# Patient Record
Sex: Female | Born: 1975 | Race: Black or African American | Hispanic: No | Marital: Married | Smoking: Current every day smoker
Health system: Southern US, Community
[De-identification: ages and names within clinical notes are randomized; demographics above are authoritative.]

## PROBLEM LIST (undated history)

## (undated) DIAGNOSIS — E119 Type 2 diabetes mellitus without complications: Secondary | ICD-10-CM

## (undated) DIAGNOSIS — D219 Benign neoplasm of connective and other soft tissue, unspecified: Secondary | ICD-10-CM

## (undated) HISTORY — PX: LAPAROSCOPIC GASTRIC SLEEVE RESECTION: SHX5895

## (undated) HISTORY — PX: FOOT SURGERY: SHX648

## (undated) HISTORY — PX: CHOLECYSTECTOMY: SHX55

---

## 1998-04-20 ENCOUNTER — Emergency Department (HOSPITAL_COMMUNITY): Admission: EM | Admit: 1998-04-20 | Discharge: 1998-04-20 | Payer: Self-pay | Admitting: Emergency Medicine

## 1998-11-05 ENCOUNTER — Emergency Department (HOSPITAL_COMMUNITY): Admission: EM | Admit: 1998-11-05 | Discharge: 1998-11-05 | Payer: Self-pay | Admitting: Emergency Medicine

## 1999-03-13 ENCOUNTER — Encounter: Payer: Self-pay | Admitting: *Deleted

## 1999-03-13 ENCOUNTER — Inpatient Hospital Stay (HOSPITAL_COMMUNITY): Admission: AD | Admit: 1999-03-13 | Discharge: 1999-03-13 | Payer: Self-pay | Admitting: Obstetrics & Gynecology

## 1999-05-11 ENCOUNTER — Inpatient Hospital Stay (HOSPITAL_COMMUNITY): Admission: AD | Admit: 1999-05-11 | Discharge: 1999-05-11 | Payer: Self-pay | Admitting: *Deleted

## 2001-04-01 ENCOUNTER — Emergency Department (HOSPITAL_COMMUNITY): Admission: EM | Admit: 2001-04-01 | Discharge: 2001-04-01 | Payer: Self-pay | Admitting: *Deleted

## 2001-06-02 ENCOUNTER — Emergency Department (HOSPITAL_COMMUNITY): Admission: EM | Admit: 2001-06-02 | Discharge: 2001-06-03 | Payer: Self-pay | Admitting: Emergency Medicine

## 2001-08-16 ENCOUNTER — Inpatient Hospital Stay (HOSPITAL_COMMUNITY): Admission: AD | Admit: 2001-08-16 | Discharge: 2001-08-16 | Payer: Self-pay | Admitting: *Deleted

## 2001-08-21 ENCOUNTER — Other Ambulatory Visit: Admission: RE | Admit: 2001-08-21 | Discharge: 2001-08-21 | Payer: Self-pay | Admitting: Obstetrics and Gynecology

## 2002-02-06 ENCOUNTER — Inpatient Hospital Stay (HOSPITAL_COMMUNITY): Admission: AD | Admit: 2002-02-06 | Discharge: 2002-02-06 | Payer: Self-pay | Admitting: *Deleted

## 2002-02-07 ENCOUNTER — Inpatient Hospital Stay (HOSPITAL_COMMUNITY): Admission: AD | Admit: 2002-02-07 | Discharge: 2002-02-07 | Payer: Self-pay | Admitting: *Deleted

## 2002-02-07 ENCOUNTER — Encounter: Payer: Self-pay | Admitting: *Deleted

## 2002-12-10 ENCOUNTER — Inpatient Hospital Stay (HOSPITAL_COMMUNITY): Admission: AD | Admit: 2002-12-10 | Discharge: 2002-12-10 | Payer: Self-pay | Admitting: Obstetrics and Gynecology

## 2003-10-29 ENCOUNTER — Inpatient Hospital Stay (HOSPITAL_COMMUNITY): Admission: AD | Admit: 2003-10-29 | Discharge: 2003-10-29 | Payer: Self-pay | Admitting: Obstetrics & Gynecology

## 2005-08-14 ENCOUNTER — Inpatient Hospital Stay (HOSPITAL_COMMUNITY): Admission: AD | Admit: 2005-08-14 | Discharge: 2005-08-14 | Payer: Self-pay | Admitting: Family Medicine

## 2005-12-02 ENCOUNTER — Emergency Department (HOSPITAL_COMMUNITY): Admission: EM | Admit: 2005-12-02 | Discharge: 2005-12-03 | Payer: Self-pay | Admitting: Emergency Medicine

## 2005-12-03 ENCOUNTER — Emergency Department (HOSPITAL_COMMUNITY): Admission: EM | Admit: 2005-12-03 | Discharge: 2005-12-03 | Payer: Self-pay | Admitting: Emergency Medicine

## 2006-01-03 ENCOUNTER — Encounter: Admission: RE | Admit: 2006-01-03 | Discharge: 2006-01-03 | Payer: Self-pay | Admitting: Gastroenterology

## 2006-12-06 ENCOUNTER — Emergency Department (HOSPITAL_COMMUNITY): Admission: EM | Admit: 2006-12-06 | Discharge: 2006-12-06 | Payer: Self-pay | Admitting: Family Medicine

## 2006-12-25 HISTORY — PX: ABLATION: SHX5711

## 2007-06-26 ENCOUNTER — Emergency Department (HOSPITAL_COMMUNITY): Admission: EM | Admit: 2007-06-26 | Discharge: 2007-06-26 | Payer: Self-pay | Admitting: Family Medicine

## 2007-07-25 ENCOUNTER — Emergency Department (HOSPITAL_COMMUNITY): Admission: EM | Admit: 2007-07-25 | Discharge: 2007-07-25 | Payer: Self-pay | Admitting: Emergency Medicine

## 2007-10-13 ENCOUNTER — Encounter: Payer: Self-pay | Admitting: Emergency Medicine

## 2007-10-13 ENCOUNTER — Observation Stay (HOSPITAL_COMMUNITY): Admission: AD | Admit: 2007-10-13 | Discharge: 2007-10-14 | Payer: Self-pay | Admitting: Obstetrics and Gynecology

## 2007-10-15 ENCOUNTER — Observation Stay (HOSPITAL_COMMUNITY): Admission: AD | Admit: 2007-10-15 | Discharge: 2007-10-16 | Payer: Self-pay | Admitting: Obstetrics and Gynecology

## 2007-11-03 ENCOUNTER — Emergency Department (HOSPITAL_COMMUNITY): Admission: EM | Admit: 2007-11-03 | Discharge: 2007-11-03 | Payer: Self-pay | Admitting: Emergency Medicine

## 2007-11-25 ENCOUNTER — Inpatient Hospital Stay (HOSPITAL_COMMUNITY): Admission: AD | Admit: 2007-11-25 | Discharge: 2007-11-25 | Payer: Self-pay | Admitting: Obstetrics and Gynecology

## 2007-11-29 ENCOUNTER — Encounter (INDEPENDENT_AMBULATORY_CARE_PROVIDER_SITE_OTHER): Payer: Self-pay | Admitting: Obstetrics and Gynecology

## 2007-11-29 ENCOUNTER — Ambulatory Visit (HOSPITAL_COMMUNITY): Admission: AD | Admit: 2007-11-29 | Discharge: 2007-11-29 | Payer: Self-pay | Admitting: Obstetrics and Gynecology

## 2008-02-29 ENCOUNTER — Emergency Department (HOSPITAL_COMMUNITY): Admission: EM | Admit: 2008-02-29 | Discharge: 2008-02-29 | Payer: Self-pay | Admitting: Emergency Medicine

## 2009-07-23 ENCOUNTER — Inpatient Hospital Stay (HOSPITAL_COMMUNITY): Admission: AD | Admit: 2009-07-23 | Discharge: 2009-07-23 | Payer: Self-pay | Admitting: Obstetrics & Gynecology

## 2009-11-03 ENCOUNTER — Emergency Department (HOSPITAL_COMMUNITY): Admission: EM | Admit: 2009-11-03 | Discharge: 2009-11-03 | Payer: Self-pay | Admitting: Family Medicine

## 2010-01-18 ENCOUNTER — Encounter: Admission: RE | Admit: 2010-01-18 | Discharge: 2010-01-18 | Payer: Self-pay | Admitting: Family Medicine

## 2010-06-26 ENCOUNTER — Emergency Department (HOSPITAL_BASED_OUTPATIENT_CLINIC_OR_DEPARTMENT_OTHER): Admission: EM | Admit: 2010-06-26 | Discharge: 2010-06-26 | Payer: Self-pay | Admitting: Emergency Medicine

## 2011-05-09 NOTE — Discharge Summary (Signed)
[name], [name]           ACCOUNT NO.:  [id number]   MEDICAL RECORD NO.:  [id number]          PATIENT TYPE:  OBV   LOCATION:  9309                          FACILITY:  [facility]   PHYSICIAN:  Naima A. Dillard, M.D. DATE OF BIRTH:  28-Apr-1976   DATE OF ADMISSION:  10/15/2007  DATE OF DISCHARGE:  10/16/2007                               DISCHARGE SUMMARY   ADMITTING DIAGNOSIS:  Menorrhagia with anemia and hemodynamic changes.   DISCHARGE DIAGNOSES:  1. Anemia secondary to menorrhagia.  2. Status post 2 units packed red blood cells.   PROCEDURE:  Two units packed red blood cells administered.   HOSPITAL COURSE:  Ms. [name] is a 35 year old gravida 5, para 3-0-2-  3 who presented for 23-hour observation on the evening of October 15, 2007, secondary to menorrhagia with a hemoglobin of 7.6 at its nadir.  On October 14, 2007, she was admitted for 2 units of blood and was  discharged this morning subsequent to the completion of those 2 units.  Her history had been remarkable for being seen at [facility] ER on October 12, 2007, for evaluation of menorrhagia.  GC and  cultures were negative at that time.  Hemoglobin was 10.  She had  negative UPT and a negative urine culture.  Her blood type was B+.  She  was then seen at [facility] on October 13, 2007, for the same.  Dr. Su Hilt was consulted as the GYN on call.  Her hemoglobin at that  time was 7.6.  Her comprehensive metabolic panel was normal except for a  glucose of 108.  She was placed on Provera.  She then was seen by her  primary physician today with her hemoglobin of 8.1 and she did have  dizziness noted.  She was then admitted for the blood transfusion.  On  admission her vital signs were stable.  Orthostatics were within normal  limits.  She does have a history of hypertension.  Blood pressures were  130/90, pulse was normal and no orthostatic changes were noted.  Two  units of packed red blood  cells were administered with premedication  with Tylenol and Benadryl.  The patient tolerated these units very well.  The second unit was completed at approximately 4:00 a.m.  There was a  two-hour CBC done subsequent to that with a hemoglobin of 10.3, white  blood cell count of 7.8 and platelet count of 355.  Her blood pressure  was 131/71, pulse was 87.  She was afebrile, respirations 20.  She was  having no bleeding.  Her physical exam was within normal limits.  She  was deemed to receive full benefit of her hospital stay and was  discharged home.   DISCHARGE MEDICATIONS:  The patient will continue medications previously  ordered.  She will take iron one p.o. daily.  Provera 40 mg was given on  October 15, 2007.  Hydrocodone may be used as needed. Maxalt and butal  were also to be used on a p.r.n. basis.  No new medications were  ordered.   DISCHARGE INSTRUCTIONS:  The patient  will continue to monitor for any  hemodynamic changes.  She will notify us with any dizziness, syncope or  any other issues.  She also will notify us with any episodes of further  heavy bleeding.   DISCHARGE FOLLOW-UP:  On October 17, 2007, at [facility] at  3:45 p.m. with Dr. Su Hilt, per Dr. Su Hilt request.      Renaldo Reel. Emilee Hero, C.N.M.      Naima A. Normand Sloop, M.D.  Electronically Signed    VLL/MEDQ  D:  10/16/2007  T:  10/16/2007  Job:  [id number]

## 2011-05-09 NOTE — Op Note (Signed)
[name], [name]           ACCOUNT NO.:  [id number]   MEDICAL RECORD NO.:  [id number]          PATIENT TYPE:  AMB   LOCATION:  [facility]                          FACILITY:  [facility]   PHYSICIAN:  Osborn Coho, M.D.   DATE OF BIRTH:  01/28/1976   DATE OF PROCEDURE:  11/29/2007  DATE OF DISCHARGE:                               OPERATIVE REPORT   PREOPERATIVE DIAGNOSES:  1. Menorrhagia.  2. 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   POSTOPERATIVE DIAGNOSES:  1. Menorrhagia.  2. 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   PROCEDURES:  1. Hysteroscopy.  2. Dilation and curettage.  3. Endometrial ablation via NovaSure.   ATTENDING:  Osborn Coho, M.D.   ANESTHESIA:  General via LMA.   FINDINGS:  No obvious intracavitary lesion.   SPECIMENS TO PATHOLOGY:  Frozen section sent with benign endometrium.  Permanent section sent as well, still pending.   FLUIDS:  1100 mL.   URINE OUTPUT:  Approximate 50 mL via straight catheterization prior to  procedure.   HYSTEROSCOPIC FLUID DEFICIT OF LR:  215 mL.   ESTIMATED BLOOD LOSS:  Minimal.   COMPLICATIONS:  None.   FINDINGS:  Uterus sounded to 9 cm, cervical length measured 4 cm, cavity  length 5 cm, cavity width 4 cm.  Power at 110 watts and ablation time of  1 minute 30 seconds.   INDICATION:  [name] is a 36 year old para 3, status post BTL,  with episodes of menorrhagia who been on Provera secondary to a history  of simple hyperplasia without atypia on endometrial biopsy.  The patient  persisted to have heavy bleeding during the time period she was being  treated with the Provera to the point where she was feeling very  fatigued and wanting something additionally done in order to help with  the bleeding.  I discussed several options with the patient, which  included a hysteroscopy D&C and only if the pathology report showed  benign endometrium would an ablation be considered, as that would be  likely to help  the most as far as her bleeding is concerned.  I did talk  about definitive management with the patient as well with a  hysterectomy, and the patient was preferred the ablation instead.  She  understood the risks of possibly hiding an occult precancerous or  cancerous lesion but wanted to proceed if the frozen section showed  benign endometrium.  The risks, benefits and alternatives were discussed  at length with the patient.  The patient verbalized understanding and  consent was signed and witnessed.   PROCEDURE:  The patient was taken to the operating room after the risks,  benefits and alternatives were reviewed with the patient, the patient  verbalized understanding and consent signed and witnessed.  The patient  was placed under general per anesthesia and prepped and draped in the  normal sterile fashion in the dorsal lithotomy position.  A bivalve  speculum was placed in the patient's vagina and a paracervical block  administered using a total of 10 mL of 1% lidocaine.  A single-tooth  tenaculum had been used on the anterior lip  of the cervix and the  measurements as noted above in the findings.  The diagnostic  hysteroscope was then introduced and no obvious intracavitary lesions  were noted.  However, visualization was somewhat difficult secondary to  bleeding.  The hysteroscope was then removed and curettage performed and  the initial specimen sent for frozen section and the remaining portions  sent for permanent section.  Hysteroscope was introduced once again and  slightly better visualization and still no intracavitary lesions noted.  Pathologist called back with the report of benign endometrium on frozen  section and NovaSure ablation was then performed.  The cervix was  dilated for passage of the NovaSure instrument, which was introduced  into the intrauterine cavity.  Measurement as noted above.  Ablation was  performed for 1 minute 30 seconds at a power of 110 watts.   Instrument  was then removed and good results noted.  There was minimal bleeding at  the end of the case.  The tenaculum was removed and there was good  hemostasis at tenaculum site.  Count was correct.  The patient tolerated  the procedure well and was awaiting transfer to the recovery room in  good condition.  This case was moved up from its originally scheduled  date of December 19 secondary to the patient presenting in the office  today with heavy vaginal bleeding.      Osborn Coho, M.D.  Electronically Signed     AR/MEDQ  D:  11/29/2007  T:  11/30/2007  Job:  [id number]

## 2011-10-02 LAB — CBC
HCT: 33.2 — ABNORMAL LOW
HCT: 34.8 — ABNORMAL LOW
Hemoglobin: 11 — ABNORMAL LOW
Hemoglobin: 11.6 — ABNORMAL LOW
MCHC: 33.1
MCHC: 33.2
MCV: 80.4
MCV: 80.5
Platelets: 473 — ABNORMAL HIGH
Platelets: 595 — ABNORMAL HIGH
RBC: 4.12
RBC: 4.33
RDW: 18.2 — ABNORMAL HIGH
RDW: 18.9 — ABNORMAL HIGH
WBC: 10.1
WBC: 8.5

## 2011-10-02 LAB — POCT PREGNANCY, URINE
Operator id: 26329
Preg Test, Ur: NEGATIVE

## 2011-10-02 LAB — SAMPLE TO BLOOD BANK

## 2011-10-02 LAB — HCG, SERUM, QUALITATIVE: Preg, Serum: NEGATIVE

## 2011-10-03 LAB — POCT I-STAT CREATININE
Creatinine, Ser: 0.9
Operator id: 294511

## 2011-10-03 LAB — I-STAT 8, (EC8 V) (CONVERTED LAB)
Acid-base deficit: 2
BUN: 8
Bicarbonate: 23.8
Chloride: 106
Glucose, Bld: 74
HCT: 42
Hemoglobin: 14.3
Operator id: 294511
Potassium: 3.6
Sodium: 140
TCO2: 25
pCO2, Ven: 42.6 — ABNORMAL LOW
pH, Ven: 7.355 — ABNORMAL HIGH

## 2011-10-03 LAB — POCT CARDIAC MARKERS
CKMB, poc: <1 — ABNORMAL LOW
Myoglobin, poc: 75.1
Operator id: 294511
Troponin i, poc: <0.05

## 2011-10-03 LAB — D-DIMER, QUANTITATIVE: D-Dimer, Quant: 0.64 — ABNORMAL HIGH

## 2011-10-04 LAB — CBC
HCT: 23.6 — ABNORMAL LOW
HCT: 27.2 — ABNORMAL LOW
HCT: 28.6 — ABNORMAL LOW
HCT: 30.5 — ABNORMAL LOW
HCT: 31.8 — ABNORMAL LOW
Hemoglobin: 10 — ABNORMAL LOW
Hemoglobin: 10.3 — ABNORMAL LOW
Hemoglobin: 7.6 — CL
Hemoglobin: 8.9 — ABNORMAL LOW
Hemoglobin: 9.3 — ABNORMAL LOW
MCHC: 31.4
MCHC: 32.2
MCHC: 32.6
MCHC: 32.9
MCHC: 33.6
MCV: 75.6 — ABNORMAL LOW
MCV: 76.5 — ABNORMAL LOW
MCV: 76.7 — ABNORMAL LOW
MCV: 76.8 — ABNORMAL LOW
MCV: 78.4
Platelets: 355
Platelets: 367
Platelets: 428 — ABNORMAL HIGH
Platelets: 461 — ABNORMAL HIGH
Platelets: 488 — ABNORMAL HIGH
RBC: 3.09 — ABNORMAL LOW
RBC: 3.6 — ABNORMAL LOW
RBC: 3.73 — ABNORMAL LOW
RBC: 3.9
RBC: 4.14
RDW: 18.7 — ABNORMAL HIGH
RDW: 18.9 — ABNORMAL HIGH
RDW: 19.3 — ABNORMAL HIGH
RDW: 19.4 — ABNORMAL HIGH
RDW: 19.4 — ABNORMAL HIGH
WBC: 7.2
WBC: 7.8
WBC: 8.5
WBC: 9.4
WBC: 9.5

## 2011-10-04 LAB — DIFFERENTIAL
Basophils Absolute: 0
Basophils Relative: 0
Eosinophils Absolute: 0.1
Eosinophils Relative: 1
Lymphocytes Relative: 25
Lymphs Abs: 2.3
Monocytes Absolute: 0.4
Monocytes Relative: 4
Neutro Abs: 6.7
Neutrophils Relative %: 70

## 2011-10-04 LAB — COMPREHENSIVE METABOLIC PANEL
ALT: 20
AST: 35
Albumin: 2.4 — ABNORMAL LOW
Alkaline Phosphatase: 79
BUN: 4 — ABNORMAL LOW
CO2: 21
Calcium: 7.6 — ABNORMAL LOW
Chloride: 108
Creatinine, Ser: 0.7
GFR calc Af Amer: >60
GFR calc non Af Amer: >60
Glucose, Bld: 108 — ABNORMAL HIGH
Potassium: 3.7
Sodium: 135
Total Bilirubin: 0.4
Total Protein: 5.3 — ABNORMAL LOW

## 2011-10-04 LAB — URINE MICROSCOPIC-ADD ON: WBC, UA: NONE SEEN

## 2011-10-04 LAB — URINALYSIS, ROUTINE W REFLEX MICROSCOPIC
Glucose, UA: NEGATIVE
Ketones, ur: NEGATIVE
Leukocytes, UA: NEGATIVE
Nitrite: NEGATIVE
Protein, ur: NEGATIVE
Specific Gravity, Urine: >1.03 — ABNORMAL HIGH
Urobilinogen, UA: 0.2
pH: 6

## 2011-10-04 LAB — URINE CULTURE
Colony Count: NO GROWTH
Culture: NO GROWTH
Special Requests: NEGATIVE

## 2011-10-04 LAB — CROSSMATCH
ABO/RH(D): B POS
Antibody Screen: NEGATIVE

## 2011-10-04 LAB — TYPE AND SCREEN
ABO/RH(D): B POS
Antibody Screen: NEGATIVE

## 2011-10-04 LAB — POCT PREGNANCY, URINE
Operator id: 294511
Preg Test, Ur: NEGATIVE

## 2011-10-04 LAB — ABO/RH
ABO/RH(D): B POS
ABO/RH(D): B POS

## 2011-10-04 LAB — GC/CHLAMYDIA PROBE AMP, GENITAL
Chlamydia, DNA Probe: NEGATIVE
GC Probe Amp, Genital: NEGATIVE

## 2012-12-31 ENCOUNTER — Encounter (HOSPITAL_BASED_OUTPATIENT_CLINIC_OR_DEPARTMENT_OTHER): Payer: Self-pay | Admitting: *Deleted

## 2012-12-31 ENCOUNTER — Emergency Department (HOSPITAL_BASED_OUTPATIENT_CLINIC_OR_DEPARTMENT_OTHER)
Admission: EM | Admit: 2012-12-31 | Discharge: 2012-12-31 | Disposition: A | Discharge status: Home / Self Care | Payer: Medicaid Other | Attending: Emergency Medicine | Admitting: Emergency Medicine

## 2012-12-31 DIAGNOSIS — F17200 Nicotine dependence, unspecified, uncomplicated: Secondary | ICD-10-CM | POA: Insufficient documentation

## 2012-12-31 DIAGNOSIS — Y929 Unspecified place or not applicable: Secondary | ICD-10-CM | POA: Insufficient documentation

## 2012-12-31 DIAGNOSIS — S39012A Strain of muscle, fascia and tendon of lower back, initial encounter: Secondary | ICD-10-CM

## 2012-12-31 DIAGNOSIS — X58XXXA Exposure to other specified factors, initial encounter: Secondary | ICD-10-CM | POA: Insufficient documentation

## 2012-12-31 DIAGNOSIS — S335XXA Sprain of ligaments of lumbar spine, initial encounter: Secondary | ICD-10-CM | POA: Insufficient documentation

## 2012-12-31 DIAGNOSIS — Y939 Activity, unspecified: Secondary | ICD-10-CM | POA: Insufficient documentation

## 2012-12-31 MED ORDER — IBUPROFEN 800 MG PO TABS: 800.0000 mg | ORAL_TABLET | Freq: Three times a day (TID) | ORAL | Status: DC

## 2012-12-31 MED ORDER — KETOROLAC TROMETHAMINE 60 MG/2ML IM SOLN
60.0000 mg | Freq: Once | INTRAMUSCULAR | Status: AC
  Administered 2012-12-31: 60 mg via INTRAMUSCULAR
  Filled 2012-12-31: qty 2

## 2012-12-31 MED ORDER — HYDROMORPHONE HCL PF 2 MG/ML IJ SOLN
2.0000 mg | Freq: Once | INTRAMUSCULAR | Status: AC
  Administered 2012-12-31: 2 mg via INTRAMUSCULAR
  Filled 2012-12-31: qty 1

## 2012-12-31 MED ORDER — HYDROCODONE-ACETAMINOPHEN 5-325 MG PO TABS: 2.0000 | ORAL_TABLET | ORAL | Status: DC | PRN

## 2012-12-31 MED ORDER — CYCLOBENZAPRINE HCL 10 MG PO TABS: 10.0000 mg | ORAL_TABLET | Freq: Two times a day (BID) | ORAL | Status: DC | PRN

## 2012-12-31 NOTE — ED Provider Notes (Signed)
History     CSN: [id number]  Arrival date & time 12/31/12  1201   First MD Initiated Contact with Patient 12/31/12 1212      Chief Complaint  Patient presents with  . Back Pain    (Consider location/radiation/quality/duration/timing/severity/associated sxs/prior treatment) Patient is a 37 y.o. female presenting with back pain. The history is provided by the patient. No language interpreter was used.  Back Pain  This is a new problem. The current episode started 6 to 12 hours ago. The problem occurs constantly. The problem has been gradually worsening. The pain is associated with no known injury. The pain is present in the lumbar spine. The quality of the pain is described as aching. The pain does not radiate. The pain is at a severity of 8/10. The pain is moderate. The symptoms are aggravated by bending. Pertinent negatives include no paresthesias and no paresis. She has tried NSAIDs for the symptoms. The treatment provided no relief. Risk factors: last back pain 2011.    History reviewed. No pertinent past medical history.  Past Surgical History  Procedure Date  . Cholecystectomy   . Ablation 2008    History reviewed. No pertinent family history.  History  Substance Use Topics  . Smoking status: Current Every Day Smoker  . Smokeless tobacco: Not on file  . Alcohol Use: No    OB History    Grav Para Term Preterm Abortions TAB SAB Ect Mult Living                  Review of Systems  Musculoskeletal: Positive for back pain.  Neurological: Negative for paresthesias.  All other systems reviewed and are negative.    Allergies  Review of patient's allergies indicates no known allergies.  Home Medications  No current outpatient prescriptions on file.  There were no vitals taken for this visit.  Physical Exam  Nursing note and vitals reviewed. Constitutional: She appears well-developed and well-nourished.  HENT:  Head: Normocephalic and atraumatic.  Eyes:  Conjunctivae normal and EOM are normal. Pupils are equal, round, and reactive to light.  Neck: Normal range of motion. Neck supple.  Cardiovascular: Normal rate.   Pulmonary/Chest: Effort normal.  Abdominal: Soft. Bowel sounds are normal.  Musculoskeletal: Normal range of motion.       Tender ls spine,  nv and ns intact  Neurological: She is alert.  Skin: Skin is warm.    ED Course  Procedures (including critical care time)  Labs Reviewed - No data to display No results found.   1. Lumbar strain       MDM  Dilaudid and torodol IM.      Pt feels better,  Pt given Rx for flexeril,  Ibuprofen and hydrocodone.   I advised follow up with Dr. Phineas Douglas for recheck    Elson Areas, PA 12/31/12 1409  Lonia Skinner [place], [place] 12/31/12 1410

## 2012-12-31 NOTE — ED Notes (Signed)
Pt states had  Back injury in 2011 this morning she was getting out of the bed heard a little pop put feet on floor had spasms in back knees buckled is ambulatory to exam room but moving slowly denies any urinary symptoms

## 2013-01-01 NOTE — ED Provider Notes (Signed)
Medical screening examination/treatment/procedure(s) were performed by non-physician practitioner and as supervising physician I was immediately available for consultation/collaboration.   Charles B. Sheldon, MD 01/01/13 0720 

## 2013-02-05 ENCOUNTER — Emergency Department (HOSPITAL_BASED_OUTPATIENT_CLINIC_OR_DEPARTMENT_OTHER)
Admission: EM | Admit: 2013-02-05 | Discharge: 2013-02-05 | Disposition: A | Discharge status: Home / Self Care | Payer: Medicaid Other | Attending: Emergency Medicine | Admitting: Emergency Medicine

## 2013-02-05 ENCOUNTER — Encounter (HOSPITAL_BASED_OUTPATIENT_CLINIC_OR_DEPARTMENT_OTHER): Payer: Self-pay | Admitting: *Deleted

## 2013-02-05 DIAGNOSIS — N760 Acute vaginitis: Secondary | ICD-10-CM | POA: Insufficient documentation

## 2013-02-05 DIAGNOSIS — F17200 Nicotine dependence, unspecified, uncomplicated: Secondary | ICD-10-CM | POA: Insufficient documentation

## 2013-02-05 DIAGNOSIS — N390 Urinary tract infection, site not specified: Secondary | ICD-10-CM

## 2013-02-05 DIAGNOSIS — B9689 Other specified bacterial agents as the cause of diseases classified elsewhere: Secondary | ICD-10-CM

## 2013-02-05 DIAGNOSIS — R300 Dysuria: Secondary | ICD-10-CM | POA: Insufficient documentation

## 2013-02-05 DIAGNOSIS — Z3202 Encounter for pregnancy test, result negative: Secondary | ICD-10-CM | POA: Insufficient documentation

## 2013-02-05 LAB — URINALYSIS, ROUTINE W REFLEX MICROSCOPIC
Glucose, UA: NEGATIVE mg/dL
Hgb urine dipstick: NEGATIVE
Ketones, ur: 15 mg/dL — AB
Nitrite: NEGATIVE
Protein, ur: NEGATIVE mg/dL
Specific Gravity, Urine: 1.03 (ref 1.005–1.030)
Urobilinogen, UA: 0.2 mg/dL (ref 0.0–1.0)
pH: 5.5 (ref 5.0–8.0)

## 2013-02-05 LAB — WET PREP, GENITAL
Trich, Wet Prep: NONE SEEN
Yeast Wet Prep HPF POC: NONE SEEN

## 2013-02-05 LAB — URINE MICROSCOPIC-ADD ON

## 2013-02-05 LAB — PREGNANCY, URINE: Preg Test, Ur: NEGATIVE

## 2013-02-05 MED ORDER — SULFAMETHOXAZOLE-TRIMETHOPRIM 800-160 MG PO TABS: 1.0000 | ORAL_TABLET | Freq: Two times a day (BID) | ORAL | Status: DC

## 2013-02-05 MED ORDER — METRONIDAZOLE 500 MG PO TABS: 500.0000 mg | ORAL_TABLET | Freq: Two times a day (BID) | ORAL | Status: DC

## 2013-02-05 NOTE — ED Notes (Signed)
Pt c/o vaginal discharge x 1 week.  

## 2013-02-05 NOTE — ED Provider Notes (Signed)
History     CSN: [id number]  Arrival date & time 02/05/13  1323   First MD Initiated Contact with Patient 02/05/13 1333      Chief Complaint  Patient presents with  . Vaginal Discharge    (Consider location/radiation/quality/duration/timing/severity/associated sxs/prior treatment) HPI Comments: Pt states that she is having vaginal discharge with dysuria:pt states that she used monistat without relief  Patient is a 37 y.o. female presenting with vaginal discharge. The history is provided by the patient. No language interpreter was used.  Vaginal Discharge This is a new problem. The current episode started in the past 7 days. The problem occurs constantly. The problem has been unchanged. Pertinent negatives include no abdominal pain, fever or vomiting. Nothing aggravates the symptoms. She has tried nothing for the symptoms.    History reviewed. No pertinent past medical history.  Past Surgical History  Procedure Laterality Date  . Cholecystectomy    . Ablation  2008    History reviewed. No pertinent family history.  History  Substance Use Topics  . Smoking status: Current Every Day Smoker  . Smokeless tobacco: Not on file  . Alcohol Use: No    OB History   Grav Para Term Preterm Abortions TAB SAB Ect Mult Living                  Review of Systems  Constitutional: Negative for fever.  Respiratory: Negative.   Cardiovascular: Negative.   Gastrointestinal: Negative for vomiting and abdominal pain.  Genitourinary: Positive for vaginal discharge.    Allergies  Review of patient's allergies indicates no known allergies.  Home Medications   Current Outpatient Rx  Name  Route  Sig  Dispense  Refill  . cyclobenzaprine (FLEXERIL) 10 MG tablet   Oral   Take 1 tablet (10 mg total) by mouth 2 (two) times daily as needed for muscle spasms.   20 tablet   0   . HYDROcodone-acetaminophen (NORCO/VICODIN) 5-325 MG per tablet   Oral   Take 2 tablets by mouth every 4  (four) hours as needed for pain.   10 tablet   0   . ibuprofen (ADVIL,MOTRIN) 800 MG tablet   Oral   Take 1 tablet (800 mg total) by mouth 3 (three) times daily.   21 tablet   0     BP 141/84  Pulse 85  Temp(Src) 98.6 F (37 C) (Oral)  Resp 16  Ht 5\' 1"  (1.549 m)  Wt 195 lb (88.451 kg)  BMI 36.86 kg/m2  Physical Exam  Nursing note and vitals reviewed. Constitutional: She is oriented to person, place, and time. She appears well-developed and well-nourished.  Cardiovascular: Normal rate and regular rhythm.   Pulmonary/Chest: Effort normal and breath sounds normal.  Abdominal: Soft. Bowel sounds are normal. There is no tenderness.  Genitourinary:  White vaginal discharge  Musculoskeletal: Normal range of motion.  Neurological: She is alert and oriented to person, place, and time.  Skin: Skin is warm and dry.  Psychiatric: She has a normal mood and affect.    ED Course  Procedures (including critical care time)  Labs Reviewed  WET PREP, GENITAL - Abnormal; Notable for the following:    Clue Cells Wet Prep HPF POC FEW (*)    WBC, Wet Prep HPF POC FEW (*)    All other components within normal limits  URINALYSIS, ROUTINE W REFLEX MICROSCOPIC - Abnormal; Notable for the following:    Color, Urine AMBER (*)    APPearance CLOUDY (*)  Bilirubin Urine SMALL (*)    Ketones, ur 15 (*)    Leukocytes, UA MODERATE (*)    All other components within normal limits  URINE MICROSCOPIC-ADD ON - Abnormal; Notable for the following:    Bacteria, UA FEW (*)    Crystals CA OXALATE CRYSTALS (*)    All other components within normal limits  GC/CHLAMYDIA PROBE AMP  URINE CULTURE  PREGNANCY, URINE   No results found.   1. UTI (lower urinary tract infection)   2. BV (bacterial vaginosis)       MDM  Std cultures sent;will treat for bv and uti        Teressa Lower, NP 02/05/13 1421

## 2013-02-05 NOTE — ED Provider Notes (Signed)
Medical screening examination/treatment/procedure(s) were performed by non-physician practitioner and as supervising physician I was immediately available for consultation/collaboration.  Araly Kaas, MD 02/05/13 1531 

## 2013-02-07 LAB — GC/CHLAMYDIA PROBE AMP
CT Probe RNA: NEGATIVE
GC Probe RNA: NEGATIVE

## 2013-02-07 LAB — URINE CULTURE

## 2013-06-14 ENCOUNTER — Emergency Department (HOSPITAL_BASED_OUTPATIENT_CLINIC_OR_DEPARTMENT_OTHER)
Admission: EM | Admit: 2013-06-14 | Discharge: 2013-06-14 | Disposition: A | Discharge status: Home / Self Care | Payer: Medicaid Other | Attending: Emergency Medicine | Admitting: Emergency Medicine

## 2013-06-14 ENCOUNTER — Emergency Department (HOSPITAL_BASED_OUTPATIENT_CLINIC_OR_DEPARTMENT_OTHER): Payer: Medicaid Other

## 2013-06-14 ENCOUNTER — Encounter (HOSPITAL_BASED_OUTPATIENT_CLINIC_OR_DEPARTMENT_OTHER): Payer: Self-pay | Admitting: *Deleted

## 2013-06-14 DIAGNOSIS — Z9089 Acquired absence of other organs: Secondary | ICD-10-CM | POA: Insufficient documentation

## 2013-06-14 DIAGNOSIS — K921 Melena: Secondary | ICD-10-CM | POA: Insufficient documentation

## 2013-06-14 DIAGNOSIS — A499 Bacterial infection, unspecified: Secondary | ICD-10-CM | POA: Insufficient documentation

## 2013-06-14 DIAGNOSIS — Z3202 Encounter for pregnancy test, result negative: Secondary | ICD-10-CM | POA: Insufficient documentation

## 2013-06-14 DIAGNOSIS — F17200 Nicotine dependence, unspecified, uncomplicated: Secondary | ICD-10-CM | POA: Insufficient documentation

## 2013-06-14 DIAGNOSIS — B9689 Other specified bacterial agents as the cause of diseases classified elsewhere: Secondary | ICD-10-CM

## 2013-06-14 DIAGNOSIS — N760 Acute vaginitis: Secondary | ICD-10-CM | POA: Insufficient documentation

## 2013-06-14 DIAGNOSIS — R079 Chest pain, unspecified: Secondary | ICD-10-CM | POA: Insufficient documentation

## 2013-06-14 DIAGNOSIS — R197 Diarrhea, unspecified: Secondary | ICD-10-CM | POA: Insufficient documentation

## 2013-06-14 DIAGNOSIS — D259 Leiomyoma of uterus, unspecified: Secondary | ICD-10-CM

## 2013-06-14 LAB — COMPREHENSIVE METABOLIC PANEL
ALT: 13 U/L (ref 0–35)
AST: 17 U/L (ref 0–37)
Albumin: 3.6 g/dL (ref 3.5–5.2)
Alkaline Phosphatase: 95 U/L (ref 39–117)
BUN: 11 mg/dL (ref 6–23)
CO2: 22 mEq/L (ref 19–32)
Calcium: 9.4 mg/dL (ref 8.4–10.5)
Chloride: 107 mEq/L (ref 96–112)
Creatinine, Ser: 0.8 mg/dL (ref 0.50–1.10)
GFR calc Af Amer: >90 mL/min (ref >90)
GFR calc non Af Amer: >90 mL/min (ref >90)
Glucose, Bld: 98 mg/dL (ref 70–99)
Potassium: 3.7 mEq/L (ref 3.5–5.1)
Sodium: 139 mEq/L (ref 135–145)
Total Bilirubin: 0.2 mg/dL — ABNORMAL LOW (ref 0.3–1.2)
Total Protein: 7.4 g/dL (ref 6.0–8.3)

## 2013-06-14 LAB — WET PREP, GENITAL
Trich, Wet Prep: NONE SEEN
Yeast Wet Prep HPF POC: NONE SEEN

## 2013-06-14 LAB — CBC WITH DIFFERENTIAL/PLATELET
Basophils Absolute: 0 10*3/uL (ref 0.0–0.1)
Basophils Relative: 0 % (ref 0–1)
Eosinophils Absolute: 0.1 10*3/uL (ref 0.0–0.7)
Eosinophils Relative: 1 % (ref 0–5)
HCT: 41.9 % (ref 36.0–46.0)
Hemoglobin: 14.6 g/dL (ref 12.0–15.0)
Lymphocytes Relative: 40 % (ref 12–46)
Lymphs Abs: 3.8 10*3/uL (ref 0.7–4.0)
MCH: 32.5 pg (ref 26.0–34.0)
MCHC: 34.8 g/dL (ref 30.0–36.0)
MCV: 93.3 fL (ref 78.0–100.0)
Monocytes Absolute: 0.7 10*3/uL (ref 0.1–1.0)
Monocytes Relative: 7 % (ref 3–12)
Neutro Abs: 5 10*3/uL (ref 1.7–7.7)
Neutrophils Relative %: 52 % (ref 43–77)
Platelets: 316 10*3/uL (ref 150–400)
RBC: 4.49 MIL/uL (ref 3.87–5.11)
RDW: 12.7 % (ref 11.5–15.5)
WBC: 9.6 10*3/uL (ref 4.0–10.5)

## 2013-06-14 LAB — URINALYSIS, ROUTINE W REFLEX MICROSCOPIC
Bilirubin Urine: NEGATIVE
Glucose, UA: NEGATIVE mg/dL
Hgb urine dipstick: NEGATIVE
Ketones, ur: 15 mg/dL — AB
Leukocytes, UA: NEGATIVE
Nitrite: NEGATIVE
Protein, ur: NEGATIVE mg/dL
Specific Gravity, Urine: 1.03 (ref 1.005–1.030)
Urobilinogen, UA: 1 mg/dL (ref 0.0–1.0)
pH: 6 (ref 5.0–8.0)

## 2013-06-14 LAB — PREGNANCY, URINE: Preg Test, Ur: NEGATIVE

## 2013-06-14 MED ORDER — METRONIDAZOLE 500 MG PO TABS: 500.0000 mg | ORAL_TABLET | Freq: Two times a day (BID) | ORAL | Status: DC

## 2013-06-14 MED ORDER — MORPHINE SULFATE 4 MG/ML IJ SOLN
4.0000 mg | Freq: Once | INTRAMUSCULAR | Status: AC
  Administered 2013-06-14: 4 mg via INTRAMUSCULAR
  Filled 2013-06-14: qty 1

## 2013-06-14 MED ORDER — HYDROCODONE-ACETAMINOPHEN 5-325 MG PO TABS: 1.0000 | ORAL_TABLET | ORAL | Status: DC | PRN

## 2013-06-14 MED ORDER — ONDANSETRON HCL 4 MG/2ML IJ SOLN
4.0000 mg | Freq: Once | INTRAMUSCULAR | Status: AC
  Administered 2013-06-14: 4 mg via INTRAMUSCULAR
  Filled 2013-06-14: qty 2

## 2013-06-14 NOTE — ED Notes (Signed)
Pelvic cart is at the bedside set up and ready for the doctor to use. 

## 2013-06-14 NOTE — ED Provider Notes (Signed)
History    This chart was scribed for Krista Human, MD by Quintella Reichert, ED scribe.  This patient was seen in room MH09/MH09 and the patient's care was started at 3:37 PM.   CSN: [id number]  Arrival date & time 06/14/13  1422         Chief Complaint  Patient presents with  . Abdominal Pain     The history is provided by the patient. No language interpreter was used.    HPI Comments: [name] is a 37 y.o. female with h/o endometriosis, fibroids and endometrial ablation who presents to the Emergency Department complaining of progressively-worsening, constant, moderate lower abdominal pain that began 1.5 weeks ago, with accompanying intermittent diarrhea.  Pain is relieved slightly by contracting her legs upward.  She has attempted to treat pain with ibuprofen 1x/day, without relief.  She notes that her stool had some red tint on one occasion but she is not sure whether it was blood or red food.  She also reports 1 short episode of sharp, stabbing right-sided chest pain 1 week ago that "took my breath away."  She denies fever, emesis, dysuria, ear pain, sore throat, cough, rash, syncope, seizure, or any other associated symptoms.  She notes that she does not have regular menstrual periods subsequent to her ablation, but that 1 month ago she experienced spotting for 3 days and had to use a pad.  When her pain began she thought symptoms were cramping and that her menstrual period may be "trying to start back up," but her pain persisted.  She denies any other chronic medical conditions or regular medication usage.  Pt is a current half-pack-a-day smoker.   History reviewed. No pertinent past medical history.  Past Surgical History  Procedure Laterality Date  . Cholecystectomy    . Ablation  2008    History reviewed. No pertinent family history.  History  Substance Use Topics  . Smoking status: Current Every Day Smoker  . Smokeless tobacco: Not on file  . Alcohol Use: No     OB History   Grav Para Term Preterm Abortions TAB SAB Ect Mult Living                  Review of Systems  Constitutional: Negative for fever.  HENT: Negative for ear pain and sore throat.   Respiratory: Negative for cough.   Cardiovascular: Positive for chest pain (One short episode a week ago).  Gastrointestinal: Positive for abdominal pain, diarrhea and blood in stool (Uncertain). Negative for vomiting.  Genitourinary: Negative for dysuria and difficulty urinating.  Skin: Negative for rash.  Neurological: Negative for seizures and syncope.  All other systems reviewed and are negative.    Allergies  Review of patient's allergies indicates no known allergies.  Home Medications   Current Outpatient Rx  Name  Route  Sig  Dispense  Refill  . cyclobenzaprine (FLEXERIL) 10 MG tablet   Oral   Take 1 tablet (10 mg total) by mouth 2 (two) times daily as needed for muscle spasms.   20 tablet   0   . HYDROcodone-acetaminophen (NORCO/VICODIN) 5-325 MG per tablet   Oral   Take 2 tablets by mouth every 4 (four) hours as needed for pain.   10 tablet   0   . ibuprofen (ADVIL,MOTRIN) 800 MG tablet   Oral   Take 1 tablet (800 mg total) by mouth 3 (three) times daily.   21 tablet   0   .  metroNIDAZOLE (FLAGYL) 500 MG tablet   Oral   Take 1 tablet (500 mg total) by mouth 2 (two) times daily.   14 tablet   0   . sulfamethoxazole-trimethoprim (SEPTRA DS) 800-160 MG per tablet   Oral   Take 1 tablet by mouth every 12 (twelve) hours.   10 tablet   0     BP 138/93  Pulse 82  Temp(Src) 98 F (36.7 C) (Oral)  Resp 20  Ht 5\' 1"  (1.549 m)  Wt 203 lb (92.08 kg)  BMI 38.38 kg/m2  SpO2 100%  Physical Exam  Nursing note and vitals reviewed. Constitutional: She is oriented to person, place, and time. She appears well-developed and well-nourished. No distress.  HENT:  Head: Normocephalic and atraumatic.  Right Ear: External ear normal.  Left Ear: External ear normal.   Nose: Nose normal.  Mouth/Throat: Oropharynx is clear and moist. No oropharyngeal exudate.  Eyes: EOM are normal.  Neck: Normal range of motion. Neck supple. No tracheal deviation present.  Cardiovascular: Normal rate, regular rhythm and normal heart sounds.   No murmur heard. Pulmonary/Chest: Effort normal and breath sounds normal. No respiratory distress. She has no wheezes. She has no rales.  Abdominal: Soft. Bowel sounds are normal. She exhibits no mass. There is tenderness (LLQ). There is no rebound.  No rigidity  Genitourinary:  Normal female external genitalia.  She has minimal mucoid discharge.  Bimanual exam shows left adnexal tenderness, no mass.  Uterus, right adnexa WNL.  Musculoskeletal: Normal range of motion. She exhibits no edema.  Extremities normal  Lymphadenopathy:    She has no cervical adenopathy.  Neurological: She is alert and oriented to person, place, and time. She exhibits normal muscle tone. Coordination normal.  Neurologically intact  Skin: Skin is warm and dry.  Psychiatric: She has a normal mood and affect. Her behavior is normal.    ED Course  Procedures (including critical care time)  DIAGNOSTIC STUDIES: Oxygen Saturation is 100% on room air, normal by my interpretation.    COORDINATION OF CARE: 3:44 PM-Discussed treatment plan which includes pain medication and labs with pt at bedside and pt agreed to plan.    Results for orders placed during the hospital encounter of 06/14/13  WET PREP, GENITAL      Result Value Range   Yeast Wet Prep HPF POC NONE SEEN  NONE SEEN   Trich, Wet Prep NONE SEEN  NONE SEEN   Clue Cells Wet Prep HPF POC MODERATE (*) NONE SEEN   WBC, Wet Prep HPF POC FEW (*) NONE SEEN  URINALYSIS, ROUTINE W REFLEX MICROSCOPIC      Result Value Range   Color, Urine YELLOW  YELLOW   APPearance CLEAR  CLEAR   Specific Gravity, Urine 1.030  1.005 - 1.030   pH 6.0  5.0 - 8.0   Glucose, UA NEGATIVE  NEGATIVE mg/dL   Hgb urine  dipstick NEGATIVE  NEGATIVE   Bilirubin Urine NEGATIVE  NEGATIVE   Ketones, ur 15 (*) NEGATIVE mg/dL   Protein, ur NEGATIVE  NEGATIVE mg/dL   Urobilinogen, UA 1.0  0.0 - 1.0 mg/dL   Nitrite NEGATIVE  NEGATIVE   Leukocytes, UA NEGATIVE  NEGATIVE  PREGNANCY, URINE      Result Value Range   Preg Test, Ur NEGATIVE  NEGATIVE  CBC WITH DIFFERENTIAL      Result Value Range   WBC 9.6  4.0 - 10.5 K/uL   RBC 4.49  3.87 - 5.11 MIL/uL  Hemoglobin 14.6  12.0 - 15.0 g/dL   HCT 16.1  09.6 - 04.5 %   MCV 93.3  78.0 - 100.0 fL   MCH 32.5  26.0 - 34.0 pg   MCHC 34.8  30.0 - 36.0 g/dL   RDW 40.9  81.1 - 91.4 %   Platelets 316  150 - 400 K/uL   Neutrophils Relative % 52  43 - 77 %   Neutro Abs 5.0  1.7 - 7.7 K/uL   Lymphocytes Relative 40  12 - 46 %   Lymphs Abs 3.8  0.7 - 4.0 K/uL   Monocytes Relative 7  3 - 12 %   Monocytes Absolute 0.7  0.1 - 1.0 K/uL   Eosinophils Relative 1  0 - 5 %   Eosinophils Absolute 0.1  0.0 - 0.7 K/uL   Basophils Relative 0  0 - 1 %   Basophils Absolute 0.0  0.0 - 0.1 K/uL  COMPREHENSIVE METABOLIC PANEL      Result Value Range   Sodium 139  135 - 145 mEq/L   Potassium 3.7  3.5 - 5.1 mEq/L   Chloride 107  96 - 112 mEq/L   CO2 22  19 - 32 mEq/L   Glucose, Bld 98  70 - 99 mg/dL   BUN 11  6 - 23 mg/dL   Creatinine, Ser 7.82  0.50 - 1.10 mg/dL   Calcium 9.4  8.4 - 95.6 mg/dL   Total Protein 7.4  6.0 - 8.3 g/dL   Albumin 3.6  3.5 - 5.2 g/dL   AST 17  0 - 37 U/L   ALT 13  0 - 35 U/L   Alkaline Phosphatase 95  39 - 117 U/L   Total Bilirubin 0.2 (*) 0.3 - 1.2 mg/dL   GFR calc non Af Amer >90  >90 mL/min   GFR calc Af Amer >90  >90 mL/min    US Transvaginal Non-ob  06/14/2013   *RADIOLOGY REPORT*  Clinical Data: Left lower quadrant pain for 2 weeks  TRANSABDOMINAL AND TRANSVAGINAL ULTRASOUND OF PELVIS  Technique:  Both transabdominal and transvaginal ultrasound examinations of the pelvis were performed.  Transabdominal technique was performed for global imaging of  the pelvis including uterus, ovaries, adnexal regions, and pelvic cul-de-sac.  It was necessary to proceed with endovaginal exam following the transabdominal exam to visualize the the uterus, endometrium and ovaries.  Comparison:  None.  Findings: Uterus:  The uterus measures 8.8 x 4.5 x 5.8 cm.  Fibroid within the posterior myometrium is identified Measuring 2.4 x 1.9 x 2.4 cm.  Endometrium: Measures 1.9 mm.  Normal in appearance.  Right ovary: Measures 2.2 x 1.3 x 2.0 cm.    Normal appearance/no adnexal mass.  Left ovary: Measures 3.4 x 2.1 x 3.0 cm.  Normal appearance/no adnexal mass  Other Findings:  No free fluid  IMPRESSION:  1.  No acute findings.  No findings to explain left lower quadrant pain. 2.  Uterine fibroid.   Original Report Authenticated By: Signa Kell, M.D.    US Pelvis Complete  06/14/2013   *RADIOLOGY REPORT*  Clinical Data: Left lower quadrant pain for 2 weeks  TRANSABDOMINAL AND TRANSVAGINAL ULTRASOUND OF PELVIS  Technique:  Both transabdominal and transvaginal ultrasound examinations of the pelvis were performed.  Transabdominal technique was performed for global imaging of the pelvis including uterus, ovaries, adnexal regions, and pelvic cul-de-sac.  It was necessary to proceed with endovaginal exam following the transabdominal exam to visualize the the uterus,  endometrium and ovaries.  Comparison:  None.  Findings: Uterus:  The uterus measures 8.8 x 4.5 x 5.8 cm.  Fibroid within the posterior myometrium is identified Measuring 2.4 x 1.9 x 2.4 cm.  Endometrium: Measures 1.9 mm.  Normal in appearance.  Right ovary: Measures 2.2 x 1.3 x 2.0 cm.    Normal appearance/no adnexal mass.  Left ovary: Measures 3.4 x 2.1 x 3.0 cm.  Normal appearance/no adnexal mass  Other Findings:  No free fluid  IMPRESSION:  1.  No acute findings.  No findings to explain left lower quadrant pain. 2.  Uterine fibroid.   Original Report Authenticated By: Signa Kell, M.D.    6:51 PM: Informed pt that labs  and blood-work were normal but that UA reveals bacterial vaginosis and US reveals a fibroid.  Explained that symptoms are likely due to vaginosis.  Discussed treatment plan including antibiotics and pain medication to treat vaginosis and f/u with OB/GYN if symptoms do not improve.  Pt expressed understanding and was agreeable.   1. Bacterial vaginosis   2. Uterine fibroid    I personally performed the services described in this documentation, which was scribed in my presence. The recorded information has been reviewed and is accurate.  [name], M.D.     Carleene Cooper III, MD 06/15/13 1311

## 2013-06-14 NOTE — ED Notes (Signed)
Pt describes LLQ "cramping" x 2 weeks. Denies urinary s/s or vaginal d/c.

## 2013-06-16 LAB — GC/CHLAMYDIA PROBE AMP
CT Probe RNA: NEGATIVE
GC Probe RNA: NEGATIVE

## 2013-06-17 ENCOUNTER — Emergency Department (HOSPITAL_BASED_OUTPATIENT_CLINIC_OR_DEPARTMENT_OTHER)
Admission: EM | Admit: 2013-06-17 | Discharge: 2013-06-17 | Disposition: A | Discharge status: Home / Self Care | Payer: Medicaid Other | Attending: Emergency Medicine | Admitting: Emergency Medicine

## 2013-06-17 ENCOUNTER — Encounter (HOSPITAL_BASED_OUTPATIENT_CLINIC_OR_DEPARTMENT_OTHER): Payer: Self-pay | Admitting: *Deleted

## 2013-06-17 DIAGNOSIS — F17200 Nicotine dependence, unspecified, uncomplicated: Secondary | ICD-10-CM | POA: Insufficient documentation

## 2013-06-17 DIAGNOSIS — D259 Leiomyoma of uterus, unspecified: Secondary | ICD-10-CM | POA: Insufficient documentation

## 2013-06-17 DIAGNOSIS — R110 Nausea: Secondary | ICD-10-CM | POA: Insufficient documentation

## 2013-06-17 DIAGNOSIS — Z8742 Personal history of other diseases of the female genital tract: Secondary | ICD-10-CM | POA: Insufficient documentation

## 2013-06-17 DIAGNOSIS — Z9089 Acquired absence of other organs: Secondary | ICD-10-CM | POA: Insufficient documentation

## 2013-06-17 DIAGNOSIS — R109 Unspecified abdominal pain: Secondary | ICD-10-CM | POA: Insufficient documentation

## 2013-06-17 HISTORY — DX: Benign neoplasm of connective and other soft tissue, unspecified: D21.9

## 2013-06-17 HISTORY — PX: ABDOMINAL HYSTERECTOMY: SHX81

## 2013-06-17 MED ORDER — DIPHENHYDRAMINE HCL 25 MG PO CAPS: 25.0000 mg | ORAL_CAPSULE | Freq: Four times a day (QID) | ORAL | Status: DC | PRN

## 2013-06-17 MED ORDER — ONDANSETRON 4 MG PO TBDP: ORAL_TABLET | ORAL | Status: DC

## 2013-06-17 MED ORDER — OXYCODONE-ACETAMINOPHEN 5-325 MG PO TABS: 1.0000 | ORAL_TABLET | ORAL | Status: DC | PRN

## 2013-06-17 MED ORDER — OXYCODONE-ACETAMINOPHEN 5-325 MG PO TABS
1.0000 | ORAL_TABLET | Freq: Once | ORAL | Status: AC
  Administered 2013-06-17: 1 via ORAL
  Filled 2013-06-17: qty 1

## 2013-06-17 MED ORDER — ONDANSETRON 4 MG PO TBDP
4.0000 mg | ORAL_TABLET | Freq: Once | ORAL | Status: AC
  Administered 2013-06-17: 4 mg via ORAL
  Filled 2013-06-17: qty 1

## 2013-06-17 MED ORDER — DIPHENHYDRAMINE HCL 25 MG PO CAPS
25.0000 mg | ORAL_CAPSULE | Freq: Once | ORAL | Status: AC
  Administered 2013-06-17: 25 mg via ORAL
  Filled 2013-06-17: qty 1

## 2013-06-17 NOTE — ED Notes (Signed)
MD at bedside.  Family present.

## 2013-06-17 NOTE — ED Provider Notes (Signed)
History    CSN: [id number] Arrival date & time 06/17/13  1451  First MD Initiated Contact with Patient 06/17/13 1517     Chief Complaint  Patient presents with  . Abdominal Pain   (Consider location/radiation/quality/duration/timing/severity/associated sxs/prior Treatment) HPI Pt seen 3 days ago for same abdominal pain. Had [place] that showed fibroid and wet prep with clue cells. Pt states she has history of endometriosis. Has f/u appointment with Ob/GYN in early July. Pt present today due to not being able to tolerate her hydrocodone. States she has itching after taking it. No oral swelling or SOB. No rash. No new vaginal bleeding or d/c. No fever, chills.  Past Medical History  Diagnosis Date  . Fibroids    Past Surgical History  Procedure Laterality Date  . Cholecystectomy    . Ablation  2008    uterine   No family history on file. History  Substance Use Topics  . Smoking status: Current Every Day Smoker  . Smokeless tobacco: Never Used  . Alcohol Use: 0.6 oz/week    1 Cans of beer per week   OB History   Grav Para Term Preterm Abortions TAB SAB Ect Mult Living                 Review of Systems  Constitutional: Negative for fever and chills.  HENT: Negative for facial swelling, trouble swallowing and voice change.   Respiratory: Negative for shortness of breath.   Cardiovascular: Negative for chest pain.  Gastrointestinal: Positive for nausea and abdominal pain. Negative for vomiting, diarrhea and constipation.  Genitourinary: Negative for dysuria, flank pain, vaginal bleeding, vaginal discharge and difficulty urinating.  Musculoskeletal: Negative for myalgias and back pain.  Skin: Negative for rash and wound.  Neurological: Negative for dizziness, weakness, light-headedness, numbness and headaches.  All other systems reviewed and are negative.    Allergies  Review of patient's allergies indicates no known allergies.  Home Medications   Current Outpatient Rx   Name  Route  Sig  Dispense  Refill  . HYDROcodone-acetaminophen (NORCO/VICODIN) 5-325 MG per tablet   Oral   Take 1 tablet by mouth every 4 (four) hours as needed for pain.   20 tablet   0   . cyclobenzaprine (FLEXERIL) 10 MG tablet   Oral   Take 1 tablet (10 mg total) by mouth 2 (two) times daily as needed for muscle spasms.   20 tablet   0   . diphenhydrAMINE (BENADRYL) 25 mg capsule   Oral   Take 1 capsule (25 mg total) by mouth every 6 (six) hours as needed for itching.   30 capsule   0   . HYDROcodone-acetaminophen (NORCO/VICODIN) 5-325 MG per tablet   Oral   Take 2 tablets by mouth every 4 (four) hours as needed for pain.   10 tablet   0   . ibuprofen (ADVIL,MOTRIN) 800 MG tablet   Oral   Take 1 tablet (800 mg total) by mouth 3 (three) times daily.   21 tablet   0   . metroNIDAZOLE (FLAGYL) 500 MG tablet   Oral   Take 1 tablet (500 mg total) by mouth 2 (two) times daily.   14 tablet   0   . metroNIDAZOLE (FLAGYL) 500 MG tablet   Oral   Take 1 tablet (500 mg total) by mouth 2 (two) times daily.   14 tablet   0   . ondansetron (ZOFRAN ODT) 4 MG disintegrating tablet  4mg  ODT q4 hours prn nausea/vomit   4 tablet   0   . oxyCODONE-acetaminophen (PERCOCET) 5-325 MG per tablet   Oral   Take 1 tablet by mouth every 4 (four) hours as needed for pain.   10 tablet   0   . sulfamethoxazole-trimethoprim (SEPTRA DS) 800-160 MG per tablet   Oral   Take 1 tablet by mouth every 12 (twelve) hours.   10 tablet   0    BP 138/84  Pulse 85  Temp(Src) 98.9 F (37.2 C) (Oral)  Resp 16  Ht 5\' 1"  (1.549 m)  Wt 203 lb (92.08 kg)  BMI 38.38 kg/m2  SpO2 98% Physical Exam  Nursing note and vitals reviewed. Constitutional: She is oriented to person, place, and time. She appears well-developed and well-nourished. No distress.  Pt watching television on iPad in no apparent distress  HENT:  Head: Normocephalic and atraumatic.  Mouth/Throat: Oropharynx is clear  and moist.  Eyes: EOM are normal. Pupils are equal, round, and reactive to light.  Neck: Normal range of motion. Neck supple.  Cardiovascular: Normal rate and regular rhythm.   Pulmonary/Chest: Effort normal and breath sounds normal. No respiratory distress. She has no wheezes. She has no rales.  Abdominal: Soft. Bowel sounds are normal. She exhibits no distension and no mass. There is tenderness (very mild LLQ/L adnexal TTP. No rebound or guarding). There is no rebound and no guarding.  Musculoskeletal: Normal range of motion. She exhibits no edema and no tenderness.  Neurological: She is alert and oriented to person, place, and time.  Skin: Skin is warm and dry. No rash noted. No erythema.  Psychiatric: She has a normal mood and affect. Her behavior is normal.    ED Course  Procedures (including critical care time) Labs Reviewed - No data to display No results found. 1. Abdominal pain     MDM  Pt with recent labs, pelvic exam and pelvic US. Do not feel repeat work up is needed. Will give oxycodone and monitor for a reaction. If tolerates well will d/c home to f/u with OB/Gyn.   Pt observed in ED several hours without reaction. Question whether itching from hydrocodone or flagyl. Will change pain meds to percocet and if reaction continues pt is instructed to stop abx. Return precautions given. Pt will f/u with OB/GYN  Loren Racer, MD 06/17/13 1654

## 2013-06-17 NOTE — ED Notes (Signed)
abd pain x "a couple weeks"-seen here Saturday and US showed fibroids- pt reports increasing pain- has appt on July 8

## 2013-10-11 ENCOUNTER — Emergency Department (HOSPITAL_BASED_OUTPATIENT_CLINIC_OR_DEPARTMENT_OTHER): Payer: Worker's Compensation

## 2013-10-11 ENCOUNTER — Emergency Department (HOSPITAL_BASED_OUTPATIENT_CLINIC_OR_DEPARTMENT_OTHER)
Admission: EM | Admit: 2013-10-11 | Discharge: 2013-10-11 | Disposition: A | Discharge status: Home / Self Care | Payer: Worker's Compensation | Attending: Emergency Medicine | Admitting: Emergency Medicine

## 2013-10-11 ENCOUNTER — Encounter (HOSPITAL_BASED_OUTPATIENT_CLINIC_OR_DEPARTMENT_OTHER): Payer: Self-pay | Admitting: Emergency Medicine

## 2013-10-11 DIAGNOSIS — T148XXA Other injury of unspecified body region, initial encounter: Secondary | ICD-10-CM | POA: Insufficient documentation

## 2013-10-11 DIAGNOSIS — Y9289 Other specified places as the place of occurrence of the external cause: Secondary | ICD-10-CM | POA: Insufficient documentation

## 2013-10-11 DIAGNOSIS — S59909A Unspecified injury of unspecified elbow, initial encounter: Secondary | ICD-10-CM | POA: Insufficient documentation

## 2013-10-11 DIAGNOSIS — W1789XA Other fall from one level to another, initial encounter: Secondary | ICD-10-CM | POA: Insufficient documentation

## 2013-10-11 DIAGNOSIS — W19XXXA Unspecified fall, initial encounter: Secondary | ICD-10-CM

## 2013-10-11 DIAGNOSIS — Y9339 Activity, other involving climbing, rappelling and jumping off: Secondary | ICD-10-CM | POA: Insufficient documentation

## 2013-10-11 DIAGNOSIS — Z8742 Personal history of other diseases of the female genital tract: Secondary | ICD-10-CM | POA: Insufficient documentation

## 2013-10-11 DIAGNOSIS — S6990XA Unspecified injury of unspecified wrist, hand and finger(s), initial encounter: Secondary | ICD-10-CM | POA: Insufficient documentation

## 2013-10-11 DIAGNOSIS — W1809XA Striking against other object with subsequent fall, initial encounter: Secondary | ICD-10-CM | POA: Insufficient documentation

## 2013-10-11 DIAGNOSIS — S4980XA Other specified injuries of shoulder and upper arm, unspecified arm, initial encounter: Secondary | ICD-10-CM | POA: Insufficient documentation

## 2013-10-11 DIAGNOSIS — IMO0002 Reserved for concepts with insufficient information to code with codable children: Secondary | ICD-10-CM | POA: Insufficient documentation

## 2013-10-11 DIAGNOSIS — F17200 Nicotine dependence, unspecified, uncomplicated: Secondary | ICD-10-CM | POA: Insufficient documentation

## 2013-10-11 DIAGNOSIS — S46909A Unspecified injury of unspecified muscle, fascia and tendon at shoulder and upper arm level, unspecified arm, initial encounter: Secondary | ICD-10-CM | POA: Insufficient documentation

## 2013-10-11 MED ORDER — HYDROCODONE-ACETAMINOPHEN 5-325 MG PO TABS: 1.0000 | ORAL_TABLET | ORAL | Status: DC | PRN

## 2013-10-11 MED ORDER — CYCLOBENZAPRINE HCL 10 MG PO TABS
5.0000 mg | ORAL_TABLET | Freq: Once | ORAL | Status: AC
  Administered 2013-10-11: 5 mg via ORAL
  Filled 2013-10-11: qty 1

## 2013-10-11 MED ORDER — HYDROCODONE-ACETAMINOPHEN 5-325 MG PO TABS
1.0000 | ORAL_TABLET | Freq: Once | ORAL | Status: AC
  Administered 2013-10-11: 1 via ORAL
  Filled 2013-10-11: qty 1

## 2013-10-11 MED ORDER — CYCLOBENZAPRINE HCL 5 MG PO TABS: 10.0000 mg | ORAL_TABLET | Freq: Three times a day (TID) | ORAL | Status: DC | PRN

## 2013-10-11 NOTE — ED Notes (Signed)
Patient reports that she fell off truck that was being loaded last pm onto gravel. Complains of lower backpain, right shoulder and neck and left elbow pain. No loc

## 2013-10-11 NOTE — ED Provider Notes (Signed)
CSN: [id number]     Arrival date & time 10/11/13  1436 History   First MD Initiated Contact with Patient 10/11/13 1539     Chief Complaint  Patient presents with  . Fall   (Consider location/radiation/quality/duration/timing/severity/associated sxs/prior Treatment) Patient is a 37 y.o. female presenting with fall. The history is provided by the patient. No language interpreter was used.  Fall This is a new problem. The current episode started yesterday. Pertinent negatives include no chills or fever. Associated symptoms comments: She was climbing down from the cab of a semi truck when she fell backward landing on gravel. She complains of back pain, right shoulder and left elbow pain. She denies having hit her head, any LOC, nausea, abdominal, neck or chest pain. She has been ambulatory. She reports pain was minimal at the time of the fall and was significantly worse when she woke this morning. .    Past Medical History  Diagnosis Date  . Fibroids    Past Surgical History  Procedure Laterality Date  . Cholecystectomy    . Ablation  2008    uterine   No family history on file. History  Substance Use Topics  . Smoking status: Current Every Day Smoker -- 0.50 packs/day    Types: Cigarettes  . Smokeless tobacco: Never Used  . Alcohol Use: 0.6 oz/week    1 Cans of beer per week   OB History   Grav Para Term Preterm Abortions TAB SAB Ect Mult Living                 Review of Systems  Constitutional: Negative for fever and chills.  HENT: Negative.   Respiratory: Negative.   Cardiovascular: Negative.   Gastrointestinal: Negative.   Musculoskeletal:       See HPI.  Skin: Negative.   Neurological: Negative.     Allergies  Review of patient's allergies indicates no known allergies.  Home Medications  No current outpatient prescriptions on file. BP 127/91  Pulse 86  Temp(Src) 98.4 F (36.9 C) (Oral)  Resp 18  SpO2 100% Physical Exam  Constitutional: She is oriented to  person, place, and time. She appears well-developed and well-nourished.  HENT:  Head: Normocephalic and atraumatic.  Neck: Normal range of motion. Neck supple.  Pulmonary/Chest: Effort normal.  Musculoskeletal:  No midline cervical tenderness. There is right lateral neck and shoulder tenderness with FROM and without swelling, or discoloration. Left elbow unremarkable in appearance. FROM without pain. Lower back has midline lower lumbar and paralumbar tenderness. No swelling.   Neurological: She is alert and oriented to person, place, and time. Coordination normal.  Ambulatory without imbalance or difficulty walking.  Skin: Skin is warm and dry.    ED Course  Procedures (including critical care time) Labs Review Labs Reviewed - No data to display Imaging Review Dg Lumbar Spine Complete  10/11/2013   CLINICAL DATA:  Larey Seat.  Back pain.  EXAM: LUMBAR SPINE - COMPLETE 4+ VIEW  COMPARISON:  CT scan 07/24/2013.  FINDINGS: Normal alignment of the lumbar vertebral bodies. Disc spaces are maintained. No acute fracture or pars defect. The visualized bony pelvis is intact.  IMPRESSION: Normal alignment and no acute bony findings.   Electronically Signed   By: Loralie Champagne M.D.   On: 10/11/2013 16:56    EKG Interpretation   None       MDM  No diagnosis found. 1. Fall 2. Musculoskeletal strain/sprain  Uncomplicated fall with x-rays negative for acute bony injury.  Arnoldo Hooker, PA-C 10/11/13 1712

## 2013-10-26 NOTE — ED Provider Notes (Signed)
Medical screening examination/treatment/procedure(s) were performed by non-physician practitioner and as supervising physician I was immediately available for consultation/collaboration.  Hurman Horn, MD 10/26/13 [phone]

## 2014-06-09 ENCOUNTER — Encounter (HOSPITAL_COMMUNITY): Payer: Self-pay | Admitting: Pharmacy Technician

## 2014-06-11 ENCOUNTER — Encounter (HOSPITAL_COMMUNITY): Payer: Self-pay

## 2014-06-11 ENCOUNTER — Encounter (HOSPITAL_COMMUNITY)
Admission: RE | Admit: 2014-06-11 | Discharge: 2014-06-11 | Disposition: A | Discharge status: Home / Self Care | Payer: Medicaid Other | Source: Ambulatory Visit | Attending: Orthopedic Surgery | Admitting: Orthopedic Surgery

## 2014-06-11 DIAGNOSIS — Z01810 Encounter for preprocedural cardiovascular examination: Secondary | ICD-10-CM | POA: Insufficient documentation

## 2014-06-11 DIAGNOSIS — Z01812 Encounter for preprocedural laboratory examination: Secondary | ICD-10-CM | POA: Insufficient documentation

## 2014-06-11 HISTORY — DX: Type 2 diabetes mellitus without complications: E11.9

## 2014-06-11 LAB — CBC
HCT: 42.3 % (ref 36.0–46.0)
Hemoglobin: 14.2 g/dL (ref 12.0–15.0)
MCH: 31.6 pg (ref 26.0–34.0)
MCHC: 33.6 g/dL (ref 30.0–36.0)
MCV: 94 fL (ref 78.0–100.0)
Platelets: 369 10*3/uL (ref 150–400)
RBC: 4.5 MIL/uL (ref 3.87–5.11)
RDW: 13.3 % (ref 11.5–15.5)
WBC: 7.8 10*3/uL (ref 4.0–10.5)

## 2014-06-11 LAB — BASIC METABOLIC PANEL
BUN: 5 mg/dL — ABNORMAL LOW (ref 6–23)
CO2: 29 mEq/L (ref 19–32)
Calcium: 9.8 mg/dL (ref 8.4–10.5)
Chloride: 104 mEq/L (ref 96–112)
Creatinine, Ser: 0.81 mg/dL (ref 0.50–1.10)
GFR calc Af Amer: >90 mL/min (ref >90)
GFR calc non Af Amer: >90 mL/min (ref >90)
Glucose, Bld: 94 mg/dL (ref 70–99)
Potassium: 4.6 mEq/L (ref 3.7–5.3)
Sodium: 141 mEq/L (ref 137–147)

## 2014-06-11 LAB — SURGICAL PCR SCREEN
MRSA, PCR: NEGATIVE
Staphylococcus aureus: NEGATIVE

## 2014-06-11 NOTE — Pre-Procedure Instructions (Signed)
[name]  06/11/2014   Your procedure is scheduled on:  Friday, June 26  Report to [facility] Main Entrance "A" at 10:30 AM.  Call this number if you have problems the morning of surgery: [phone]   Remember:   Do not eat food or drink liquids after midnight.   Take these medicines the morning of surgery with A SIP OF WATER: hydrocodone, benadryl if needed   Do not wear jewelry, make-up or nail polish.  Do not wear lotions, powders, or perfumes. You may wear deodorant.  Do not shave 48 hours prior to surgery. Men may shave face and neck.  Do not bring valuables to the hospital.  [facility] is not responsible  for any belongings or valuables.               Contacts, dentures or bridgework may not be worn into surgery.  Leave suitcase in the car. After surgery it may be brought to your room.  For patients admitted to the hospital, discharge time is determined by your                treatment team.               Patients discharged the day of surgery will not be allowed to drive  home.  Name and phone number of your driver:   Special Instructions: review preparing for surgery handout   Please read over the following fact sheets that you were given: Pain Booklet, Coughing and Deep Breathing, MRSA Information and Surgical Site Infection Prevention

## 2014-06-11 NOTE — Progress Notes (Signed)
Primary: [facility] in [place], [place]:  Manley Mason, NP

## 2014-06-11 NOTE — Pre-Procedure Instructions (Signed)
[name]  06/11/2014   Your procedure is scheduled on:  Friday, June 26.  Report to [facility] Admitting at 10:30 AM.  Call this number if you have problems the morning of surgery: [phone]   Remember:   Do not eat food or drink liquids after midnight Thursday, June 25.   Take these medicines the morning of surgery with A SIP OF WATER:  Pain Medication and Benadryl if needed   Do not wear jewelry, make-up or nail polish.  Do not wear lotions, powders, or perfumes.   Do not shave 48 hours prior to surgery.   Do not bring valuables to the hospital.              [facility] is not responsible  for any belongings or valuables.               Contacts, dentures or bridgework may not be worn into surgery.  Leave suitcase in the car. After surgery it may be brought to your room.  For patients admitted to the hospital, discharge time is determined by your treatment team.               Patients discharged the day of surgery will not be allowed to drive home.  Name and phone number of your driver:  -  Special Instructions: Review  [place] - Preparing For Surgery.   Please read over the following fact sheets that you were given: Pain Booklet, Coughing and Deep Breathing and Surgical Site Infection Prevention

## 2014-06-18 ENCOUNTER — Ambulatory Visit (HOSPITAL_COMMUNITY): Admission: RE | Admit: 2014-06-18 | Payer: Medicaid Other | Source: Ambulatory Visit

## 2014-06-18 MED ORDER — CEFAZOLIN SODIUM-DEXTROSE 2-3 GM-% IV SOLR
2.0000 g | INTRAVENOUS | Status: AC
  Administered 2014-06-19: 2 g via INTRAVENOUS
  Filled 2014-06-18: qty 50

## 2014-06-18 MED ORDER — DEXAMETHASONE SODIUM PHOSPHATE 4 MG/ML IJ SOLN
4.0000 mg | Freq: Once | INTRAMUSCULAR | Status: AC
  Administered 2014-06-19: 4 mg via INTRAVENOUS
  Filled 2014-06-18: qty 1

## 2014-06-18 MED ORDER — ACETAMINOPHEN 10 MG/ML IV SOLN: 1000.0000 mg | Freq: Four times a day (QID) | INTRAVENOUS | Status: DC

## 2014-06-19 ENCOUNTER — Observation Stay (HOSPITAL_COMMUNITY): Payer: Medicaid Other

## 2014-06-19 ENCOUNTER — Ambulatory Visit (HOSPITAL_COMMUNITY): Payer: Medicaid Other | Admitting: Certified Registered"

## 2014-06-19 ENCOUNTER — Ambulatory Visit (HOSPITAL_COMMUNITY): Payer: Medicaid Other

## 2014-06-19 ENCOUNTER — Encounter (HOSPITAL_COMMUNITY)
Admission: RE | Disposition: A | Discharge status: Home / Self Care | Payer: Self-pay | Source: Ambulatory Visit | Attending: Orthopedic Surgery

## 2014-06-19 ENCOUNTER — Observation Stay (HOSPITAL_COMMUNITY)
Admission: RE | Admit: 2014-06-19 | Discharge: 2014-06-20 | Disposition: A | Discharge status: Home / Self Care | Payer: Medicaid Other | Source: Ambulatory Visit | Attending: Orthopedic Surgery | Admitting: Orthopedic Surgery

## 2014-06-19 ENCOUNTER — Encounter (HOSPITAL_COMMUNITY): Payer: Medicaid Other | Admitting: Certified Registered"

## 2014-06-19 ENCOUNTER — Encounter (HOSPITAL_COMMUNITY): Payer: Self-pay | Admitting: Anesthesiology

## 2014-06-19 DIAGNOSIS — M47812 Spondylosis without myelopathy or radiculopathy, cervical region: Principal | ICD-10-CM | POA: Insufficient documentation

## 2014-06-19 DIAGNOSIS — M542 Cervicalgia: Secondary | ICD-10-CM | POA: Diagnosis present

## 2014-06-19 DIAGNOSIS — F17200 Nicotine dependence, unspecified, uncomplicated: Secondary | ICD-10-CM | POA: Insufficient documentation

## 2014-06-19 DIAGNOSIS — E119 Type 2 diabetes mellitus without complications: Secondary | ICD-10-CM | POA: Insufficient documentation

## 2014-06-19 DIAGNOSIS — M5020 Other cervical disc displacement, unspecified cervical region: Secondary | ICD-10-CM | POA: Insufficient documentation

## 2014-06-19 HISTORY — PX: ANTERIOR CERVICAL DECOMP/DISCECTOMY FUSION: SHX1161

## 2014-06-19 LAB — GLUCOSE, CAPILLARY
Glucose-Capillary: 130 mg/dL — ABNORMAL HIGH (ref 70–99)
Glucose-Capillary: 191 mg/dL — ABNORMAL HIGH (ref 70–99)
Glucose-Capillary: 197 mg/dL — ABNORMAL HIGH (ref 70–99)
Glucose-Capillary: 89 mg/dL (ref 70–99)

## 2014-06-19 SURGERY — ANTERIOR CERVICAL DECOMPRESSION/DISCECTOMY FUSION 2 LEVELS
Anesthesia: General | Site: Neck

## 2014-06-19 MED ORDER — HYDROCODONE-ACETAMINOPHEN 10-325 MG PO TABS: 1.0000 | ORAL_TABLET | Freq: Four times a day (QID) | ORAL | Status: DC | PRN

## 2014-06-19 MED ORDER — FENTANYL CITRATE 0.05 MG/ML IJ SOLN
INTRAMUSCULAR | Status: AC
  Filled 2014-06-19: qty 5

## 2014-06-19 MED ORDER — SODIUM CHLORIDE 0.9 % IJ SOLN: 3.0000 mL | Freq: Two times a day (BID) | INTRAMUSCULAR | Status: DC

## 2014-06-19 MED ORDER — GLYCOPYRROLATE 0.2 MG/ML IJ SOLN
INTRAMUSCULAR | Status: AC
  Filled 2014-06-19: qty 2

## 2014-06-19 MED ORDER — NICOTINE 14 MG/24HR TD PT24
14.0000 mg | MEDICATED_PATCH | Freq: Every day | TRANSDERMAL | Status: DC
  Administered 2014-06-19 – 2014-06-20 (×2): 14 mg via TRANSDERMAL
  Filled 2014-06-19 (×2): qty 1

## 2014-06-19 MED ORDER — PROPOFOL INFUSION 10 MG/ML OPTIME
INTRAVENOUS | Status: DC | PRN
  Administered 2014-06-19: 50 ug/kg/min via INTRAVENOUS

## 2014-06-19 MED ORDER — FENTANYL CITRATE 0.05 MG/ML IJ SOLN
INTRAMUSCULAR | Status: DC | PRN
  Administered 2014-06-19: 100 ug via INTRAVENOUS
  Administered 2014-06-19 (×6): 50 ug via INTRAVENOUS

## 2014-06-19 MED ORDER — MIDAZOLAM HCL 2 MG/2ML IJ SOLN
INTRAMUSCULAR | Status: AC
  Filled 2014-06-19: qty 2

## 2014-06-19 MED ORDER — BUPIVACAINE HCL (PF) 0.25 % IJ SOLN
INTRAMUSCULAR | Status: AC
  Filled 2014-06-19: qty 30

## 2014-06-19 MED ORDER — MIDAZOLAM HCL 5 MG/5ML IJ SOLN
INTRAMUSCULAR | Status: DC | PRN
  Administered 2014-06-19: 1 mg via INTRAVENOUS

## 2014-06-19 MED ORDER — ROCURONIUM BROMIDE 50 MG/5ML IV SOLN
INTRAVENOUS | Status: AC
  Filled 2014-06-19: qty 1

## 2014-06-19 MED ORDER — METFORMIN HCL 500 MG PO TABS
500.0000 mg | ORAL_TABLET | Freq: Every day | ORAL | Status: DC
  Administered 2014-06-20: 500 mg via ORAL
  Filled 2014-06-19 (×2): qty 1

## 2014-06-19 MED ORDER — PROPOFOL 10 MG/ML IV BOLUS
INTRAVENOUS | Status: AC
  Filled 2014-06-19: qty 20

## 2014-06-19 MED ORDER — METHOCARBAMOL 1000 MG/10ML IJ SOLN
500.0000 mg | Freq: Four times a day (QID) | INTRAVENOUS | Status: DC | PRN
  Administered 2014-06-19: 500 mg via INTRAVENOUS
  Filled 2014-06-19: qty 5

## 2014-06-19 MED ORDER — HYDROMORPHONE HCL PF 1 MG/ML IJ SOLN
0.2500 mg | INTRAMUSCULAR | Status: DC | PRN
  Administered 2014-06-19 (×3): 0.5 mg via INTRAVENOUS

## 2014-06-19 MED ORDER — LIDOCAINE HCL (CARDIAC) 20 MG/ML IV SOLN
INTRAVENOUS | Status: AC
  Filled 2014-06-19: qty 5

## 2014-06-19 MED ORDER — HEMOSTATIC AGENTS (NO CHARGE) OPTIME
TOPICAL | Status: DC | PRN
  Administered 2014-06-19: 1 via TOPICAL

## 2014-06-19 MED ORDER — SODIUM CHLORIDE 0.9 % IJ SOLN: 3.0000 mL | INTRAMUSCULAR | Status: DC | PRN

## 2014-06-19 MED ORDER — ONDANSETRON HCL 4 MG PO TABS: 4.0000 mg | ORAL_TABLET | Freq: Three times a day (TID) | ORAL | Status: DC | PRN

## 2014-06-19 MED ORDER — DEXAMETHASONE 4 MG PO TABS
4.0000 mg | ORAL_TABLET | Freq: Four times a day (QID) | ORAL | Status: DC
  Administered 2014-06-20 (×2): 4 mg via ORAL
  Filled 2014-06-19 (×7): qty 1

## 2014-06-19 MED ORDER — ONDANSETRON HCL 4 MG/2ML IJ SOLN
INTRAMUSCULAR | Status: AC
  Filled 2014-06-19: qty 2

## 2014-06-19 MED ORDER — SUCCINYLCHOLINE CHLORIDE 20 MG/ML IJ SOLN
INTRAMUSCULAR | Status: DC | PRN
  Administered 2014-06-19: 120 mg via INTRAVENOUS

## 2014-06-19 MED ORDER — DEXAMETHASONE SODIUM PHOSPHATE 4 MG/ML IJ SOLN
4.0000 mg | Freq: Once | INTRAMUSCULAR | Status: DC
  Filled 2014-06-19: qty 1

## 2014-06-19 MED ORDER — ACETAMINOPHEN 10 MG/ML IV SOLN
1000.0000 mg | Freq: Four times a day (QID) | INTRAVENOUS | Status: DC
  Administered 2014-06-19 – 2014-06-20 (×3): 1000 mg via INTRAVENOUS
  Filled 2014-06-19 (×4): qty 100

## 2014-06-19 MED ORDER — THROMBIN 20000 UNITS EX SOLR
CUTANEOUS | Status: AC
  Filled 2014-06-19: qty 20000

## 2014-06-19 MED ORDER — INSULIN ASPART 100 UNIT/ML ~~LOC~~ SOLN
0.0000 [IU] | SUBCUTANEOUS | Status: DC
  Administered 2014-06-19 – 2014-06-20 (×4): 3 [IU] via SUBCUTANEOUS

## 2014-06-19 MED ORDER — HYDROMORPHONE HCL PF 1 MG/ML IJ SOLN
INTRAMUSCULAR | Status: AC
  Filled 2014-06-19: qty 1

## 2014-06-19 MED ORDER — ARTIFICIAL TEARS OP OINT
TOPICAL_OINTMENT | OPHTHALMIC | Status: AC
  Filled 2014-06-19: qty 3.5

## 2014-06-19 MED ORDER — NEOSTIGMINE METHYLSULFATE 10 MG/10ML IV SOLN
INTRAVENOUS | Status: AC
  Filled 2014-06-19: qty 1

## 2014-06-19 MED ORDER — ONDANSETRON HCL 4 MG/2ML IJ SOLN
INTRAMUSCULAR | Status: DC | PRN
  Administered 2014-06-19: 4 mg via INTRAVENOUS

## 2014-06-19 MED ORDER — LIDOCAINE HCL (CARDIAC) 20 MG/ML IV SOLN
INTRAVENOUS | Status: DC | PRN
  Administered 2014-06-19: 80 mg via INTRAVENOUS

## 2014-06-19 MED ORDER — LACTATED RINGERS IV SOLN
INTRAVENOUS | Status: DC | PRN
  Administered 2014-06-19 (×2): via INTRAVENOUS

## 2014-06-19 MED ORDER — SODIUM CHLORIDE 0.9 % IV SOLN: 250.0000 mL | INTRAVENOUS | Status: DC

## 2014-06-19 MED ORDER — METHOCARBAMOL 500 MG PO TABS
500.0000 mg | ORAL_TABLET | Freq: Four times a day (QID) | ORAL | Status: DC | PRN
  Administered 2014-06-20: 500 mg via ORAL
  Filled 2014-06-19 (×2): qty 1

## 2014-06-19 MED ORDER — BUPIVACAINE-EPINEPHRINE (PF) 0.25% -1:200000 IJ SOLN
INTRAMUSCULAR | Status: AC
  Filled 2014-06-19: qty 30

## 2014-06-19 MED ORDER — MENTHOL 3 MG MT LOZG: 1.0000 | LOZENGE | OROMUCOSAL | Status: DC | PRN

## 2014-06-19 MED ORDER — HYDROMORPHONE HCL PF 1 MG/ML IJ SOLN
INTRAMUSCULAR | Status: AC
  Administered 2014-06-19: 0.5 mg via INTRAVENOUS
  Filled 2014-06-19: qty 1

## 2014-06-19 MED ORDER — OXYCODONE HCL 5 MG PO TABS
10.0000 mg | ORAL_TABLET | ORAL | Status: DC | PRN
  Administered 2014-06-19 – 2014-06-20 (×5): 10 mg via ORAL
  Filled 2014-06-19 (×5): qty 2

## 2014-06-19 MED ORDER — PHENOL 1.4 % MT LIQD
1.0000 | OROMUCOSAL | Status: DC | PRN
  Administered 2014-06-19: 1 via OROMUCOSAL
  Filled 2014-06-19: qty 177

## 2014-06-19 MED ORDER — GLYCOPYRROLATE 0.2 MG/ML IJ SOLN
INTRAMUSCULAR | Status: DC | PRN
  Administered 2014-06-19: 0.4 mg via INTRAVENOUS

## 2014-06-19 MED ORDER — ROCURONIUM BROMIDE 100 MG/10ML IV SOLN
INTRAVENOUS | Status: DC | PRN
  Administered 2014-06-19: 10 mg via INTRAVENOUS
  Administered 2014-06-19: 40 mg via INTRAVENOUS

## 2014-06-19 MED ORDER — METHOCARBAMOL 500 MG PO TABS: 500.0000 mg | ORAL_TABLET | Freq: Three times a day (TID) | ORAL | Status: DC | PRN

## 2014-06-19 MED ORDER — ACETAMINOPHEN 10 MG/ML IV SOLN
1000.0000 mg | Freq: Four times a day (QID) | INTRAVENOUS | Status: DC
  Administered 2014-06-19: 1000 mg via INTRAVENOUS
  Filled 2014-06-19: qty 100

## 2014-06-19 MED ORDER — ONDANSETRON HCL 4 MG/2ML IJ SOLN: 4.0000 mg | Freq: Once | INTRAMUSCULAR | Status: DC | PRN

## 2014-06-19 MED ORDER — 0.9 % SODIUM CHLORIDE (POUR BTL) OPTIME
TOPICAL | Status: DC | PRN
  Administered 2014-06-19: 1000 mL

## 2014-06-19 MED ORDER — LACTATED RINGERS IV SOLN
INTRAVENOUS | Status: DC
  Administered 2014-06-19: 11:00:00 via INTRAVENOUS

## 2014-06-19 MED ORDER — BUPIVACAINE-EPINEPHRINE 0.25% -1:200000 IJ SOLN
INTRAMUSCULAR | Status: DC | PRN
  Administered 2014-06-19: 14 mL

## 2014-06-19 MED ORDER — NEOSTIGMINE METHYLSULFATE 10 MG/10ML IV SOLN
INTRAVENOUS | Status: DC | PRN
  Administered 2014-06-19: 3 mg via INTRAVENOUS

## 2014-06-19 MED ORDER — PROPOFOL 10 MG/ML IV BOLUS
INTRAVENOUS | Status: DC | PRN
  Administered 2014-06-19: 200 mg via INTRAVENOUS

## 2014-06-19 MED ORDER — LIDOCAINE HCL 4 % MT SOLN
OROMUCOSAL | Status: DC | PRN
  Administered 2014-06-19: 4 mL via TOPICAL

## 2014-06-19 MED ORDER — ONDANSETRON HCL 4 MG/2ML IJ SOLN: 4.0000 mg | INTRAMUSCULAR | Status: DC | PRN

## 2014-06-19 MED ORDER — DIPHENHYDRAMINE HCL 25 MG PO TABS
25.0000 mg | ORAL_TABLET | Freq: Three times a day (TID) | ORAL | Status: DC
  Administered 2014-06-19 – 2014-06-20 (×3): 25 mg via ORAL
  Filled 2014-06-19 (×7): qty 1

## 2014-06-19 MED ORDER — DEXAMETHASONE SODIUM PHOSPHATE 4 MG/ML IJ SOLN
4.0000 mg | Freq: Four times a day (QID) | INTRAMUSCULAR | Status: DC
  Administered 2014-06-19 – 2014-06-20 (×2): 4 mg via INTRAVENOUS
  Filled 2014-06-19 (×7): qty 1

## 2014-06-19 MED ORDER — NEOSTIGMINE METHYLSULFATE 10 MG/10ML IV SOLN
INTRAVENOUS | Status: AC
Start: 2014-06-19 — End: 2014-06-19
  Filled 2014-06-19: qty 1

## 2014-06-19 MED ORDER — THROMBIN 20000 UNITS EX SOLR
CUTANEOUS | Status: DC | PRN
  Administered 2014-06-19: 13:00:00 via TOPICAL

## 2014-06-19 MED ORDER — CEFAZOLIN SODIUM 1-5 GM-% IV SOLN
1.0000 g | Freq: Three times a day (TID) | INTRAVENOUS | Status: AC
  Administered 2014-06-19 – 2014-06-20 (×2): 1 g via INTRAVENOUS
  Filled 2014-06-19 (×2): qty 50

## 2014-06-19 MED ORDER — LACTATED RINGERS IV SOLN
INTRAVENOUS | Status: DC
  Administered 2014-06-20: 01:00:00 via INTRAVENOUS

## 2014-06-19 SURGICAL SUPPLY — 63 items
BLADE SURG ROTATE 9660 (MISCELLANEOUS) IMPLANT
BUR EGG ELITE 4.0 (BURR) IMPLANT
BUR MATCHSTICK NEURO 3.0 LAGG (BURR) IMPLANT
CANISTER SUCTION 2500CC (MISCELLANEOUS) ×2 IMPLANT
CLSR STERI-STRIP ANTIMIC 1/2X4 (GAUZE/BANDAGES/DRESSINGS) ×2 IMPLANT
CORDS BIPOLAR (ELECTRODE) ×2 IMPLANT
COVER SURGICAL LIGHT HANDLE (MISCELLANEOUS) ×4 IMPLANT
CRADLE DONUT ADULT HEAD (MISCELLANEOUS) ×2 IMPLANT
DRAPE C-ARM 42X72 X-RAY (DRAPES) ×2 IMPLANT
DRAPE POUCH INSTRU U-SHP 10X18 (DRAPES) ×2 IMPLANT
DRAPE SURG 17X23 STRL (DRAPES) ×2 IMPLANT
DRAPE U-SHAPE 47X51 STRL (DRAPES) ×2 IMPLANT
DRSG MEPILEX BORDER 4X4 (GAUZE/BANDAGES/DRESSINGS) ×2 IMPLANT
DURAPREP 26ML APPLICATOR (WOUND CARE) ×2 IMPLANT
ELECT COATED BLADE 2.86 ST (ELECTRODE) ×2 IMPLANT
ELECT PENCIL ROCKER SW 15FT (MISCELLANEOUS) ×2 IMPLANT
ELECT REM PT RETURN 9FT ADLT (ELECTROSURGICAL) ×2
ELECTRODE REM PT RTRN 9FT ADLT (ELECTROSURGICAL) ×1 IMPLANT
GLOVE BIOGEL PI IND STRL 8 (GLOVE) ×1 IMPLANT
GLOVE BIOGEL PI IND STRL 8.5 (GLOVE) ×1 IMPLANT
GLOVE BIOGEL PI INDICATOR 8 (GLOVE) ×1
GLOVE BIOGEL PI INDICATOR 8.5 (GLOVE) ×1
GLOVE ECLIPSE 8.5 STRL (GLOVE) ×2 IMPLANT
GLOVE ORTHO TXT STRL SZ7.5 (GLOVE) ×2 IMPLANT
GOWN STRL REUS W/ TWL XL LVL3 (GOWN DISPOSABLE) ×2 IMPLANT
GOWN STRL REUS W/TWL 2XL LVL3 (GOWN DISPOSABLE) ×4 IMPLANT
GOWN STRL REUS W/TWL XL LVL3 (GOWN DISPOSABLE) ×4
INTERLOCK LRDTC CRVCL VBR 7MM (Bone Implant) IMPLANT
KIT BASIN OR (CUSTOM PROCEDURE TRAY) ×2 IMPLANT
KIT ROOM TURNOVER OR (KITS) ×2 IMPLANT
LORDOTIC CERVICAL VBR 7MM SM (Bone Implant) ×4 IMPLANT
NDL SPNL 18GX3.5 QUINCKE PK (NEEDLE) ×1 IMPLANT
NEEDLE SPNL 18GX3.5 QUINCKE PK (NEEDLE) ×2 IMPLANT
NS IRRIG 1000ML POUR BTL (IV SOLUTION) ×2 IMPLANT
PACK ORTHO CERVICAL (CUSTOM PROCEDURE TRAY) ×2 IMPLANT
PACK UNIVERSAL I (CUSTOM PROCEDURE TRAY) ×2 IMPLANT
PAD ARMBOARD 7.5X6 YLW CONV (MISCELLANEOUS) ×4 IMPLANT
PATTIES SURGICAL .25X.25 (GAUZE/BANDAGES/DRESSINGS) ×2 IMPLANT
PIN DISTRACTION 14 (PIN) ×1 IMPLANT
PIN RETAINER PRODISC 14 MM (PIN) ×1 IMPLANT
PLATE SKYLINE TWO LEVEL 32MM (Plate) ×1 IMPLANT
PUTTY BONE DBX 5CC MIX (Putty) ×1 IMPLANT
RESTRAINT LIMB HOLDER UNIV (RESTRAINTS) ×2 IMPLANT
SCREW SKYLINE 14MM SD-VA (Screw) ×4 IMPLANT
SCREW SKYLINE 16MM (Screw) ×2 IMPLANT
SPONGE INTESTINAL PEANUT (DISPOSABLE) ×2 IMPLANT
SPONGE LAP 4X18 X RAY DECT (DISPOSABLE) ×4 IMPLANT
SPONGE SURGIFOAM ABS GEL 100 (HEMOSTASIS) ×2 IMPLANT
STRIP CLOSURE SKIN 1/2X4 (GAUZE/BANDAGES/DRESSINGS) ×1 IMPLANT
SURGIFLO TRUKIT (HEMOSTASIS) ×1 IMPLANT
SUT BONE WAX W31G (SUTURE) ×2 IMPLANT
SUT MON AB 3-0 SH 27 (SUTURE) ×2
SUT MON AB 3-0 SH27 (SUTURE) ×1 IMPLANT
SUT SILK 2 0 (SUTURE)
SUT SILK 2-0 18XBRD TIE 12 (SUTURE) IMPLANT
SUT VIC AB 2-0 CT1 18 (SUTURE) ×2 IMPLANT
SYR BULB IRRIGATION 50ML (SYRINGE) ×2 IMPLANT
SYR CONTROL 10ML LL (SYRINGE) ×2 IMPLANT
TAPE CLOTH 4X10 WHT NS (GAUZE/BANDAGES/DRESSINGS) ×2 IMPLANT
TAPE UMBILICAL COTTON 1/8X30 (MISCELLANEOUS) ×2 IMPLANT
TOWEL OR 17X24 6PK STRL BLUE (TOWEL DISPOSABLE) ×2 IMPLANT
TOWEL OR 17X26 10 PK STRL BLUE (TOWEL DISPOSABLE) ×2 IMPLANT
WATER STERILE IRR 1000ML POUR (IV SOLUTION) ×2 IMPLANT

## 2014-06-19 NOTE — Brief Op Note (Signed)
06/19/2014  3:39 PM  PATIENT:  [name]  38 y.o. 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  PROCEDURE:  Procedure(s): ACDF/ANTERIOR CERVICAL DISCECTOMY FUSION  C4-C6 (2 LEVELS)  (N/A)  SURGEON:  Surgeon(s) and Role:    * Melina Schools, MD - Primary  PHYSICIAN ASSISTANT:   ASSISTANTS: none   ANESTHESIA:   none  EBL:  Total I/O In: 1200 [I.V.:1200] Out: 100 [Blood:100]  BLOOD ADMINISTERED:none  DRAINS: none   LOCAL MEDICATIONS USED:  MARCAINE     SPECIMEN:  No Specimen  DISPOSITION OF SPECIMEN:  N/A  COUNTS:  YES  TOURNIQUET:  * No tourniquets in log *  DICTATION: .Other Dictation: Dictation Number [id number]  PLAN OF CARE: Admit for overnight observation  PATIENT DISPOSITION:  PACU - hemodynamically stable.

## 2014-06-19 NOTE — Plan of Care (Signed)
Problem: Consults Goal: Diagnosis - Spinal Surgery Cervical Spine Fusion     

## 2014-06-19 NOTE — Transfer of Care (Signed)
Immediate Anesthesia Transfer of Care Note  Patient: [name]  Procedure(s) Performed: Procedure(s): ACDF/ANTERIOR CERVICAL DISCECTOMY FUSION  C4-C6 (2 LEVELS)  (N/A)  Patient Location: PACU  Anesthesia Type:General  Level of Consciousness: awake, alert  and oriented  Airway & Oxygen Therapy: Patient Spontanous Breathing and Patient connected to face mask oxygen  Post-op Assessment: Report given to PACU RN  Post vital signs: Reviewed and stable  Complications: No apparent anesthesia complications

## 2014-06-19 NOTE — Anesthesia Postprocedure Evaluation (Signed)
  Anesthesia Post-op Note  Patient: [name]  Procedure(s) Performed: Procedure(s): ACDF/ANTERIOR CERVICAL DISCECTOMY FUSION  C4-C6 (2 LEVELS)  (N/A)  Patient Location: PACU  Anesthesia Type:General  Level of Consciousness: awake  Airway and Oxygen Therapy: Patient Spontanous Breathing  Post-op Pain: mild  Post-op Assessment: Post-op Vital signs reviewed  Post-op Vital Signs: Reviewed  Last Vitals:  Filed Vitals:   06/19/14 1056  BP: 124/94  Pulse: 96  Temp: 36.8 C  Resp: 20    Complications: No apparent anesthesia complications

## 2014-06-19 NOTE — H&P (Signed)
History of Present Illness The patient is a 38 year old female who comes in today for a preoperative History and Physical. The patient is scheduled for a ACDF C4-6 to be performed by Dr. Duane Lope D. Rolena Infante, MD at [facility] on 06-19-14 . Please see the hospital record for complete dictated history and physical.  She returns today for followup and her preop H&P. She continues to have significant neck and radicular left arm pain.  She had the left C6 selective nerve root block done by Dr. Nelva Bush on 04-10-14 and had about 2 days of significant pain relief. We've gone over her MRI again which shows the 4-5 left disc extrusion with compression of the C5 nerve root on the left with the other left posterolateral disc extrusion causing compression of the left C6 nerve root.  At this point, even though there is a small disc at 3-4, that's going to the right and she's not having much right cervical or trapezial pain. All of her discomfort is neck, left shoulder, and left arm pain.   Allergies No Known Drug Allergies. 02/11/2014    Social History Tobacco / smoke exposure. 02/11/2014: no Number of flights of stairs before winded. 1 Current work status. working full time Not under pain contract Exercise. Exercises never Living situation. live with spouse Marital status. married Children. 3 No history of drug/alcohol rehab Tobacco use. Current every day smoker. 02/11/2014: smoke(d) 1/2 pack(s) per day Current drinker. 02/11/2014: Currently drinks beer only occasionally per week    Medication History Norco (5-325MG  Tablet, 1 (one) Tablet Oral tid prn, Taken 05/19/2014 to 06/08/2014) Inactive. (rx for pick up 05/20/14 per ddb/smt) Benadryl Allergy (25MG  Capsule, Oral) Active. MetFORMIN HCl (500MG  Tablet, Oral) Active. Medications Reconciled.    Past Surgical History Gallbladder Surgery. laporoscopic Hysterectomy. partial (non-cancerous) Tubal  Ligation Cesarean Delivery. 3 or more times Dilation and Curettage of Uterus Foot Surgery. right    Vitals 06/15/2014 10:09 AM Weight: 213 lb Height: 61 in Body Surface Area: 2.04 m Body Mass Index: 40.25 kg/m Temp.: 98.8 F (Oral) Pulse: 101 (Regular) BP: 138/88 (Sitting, Left Arm, Standard)     Objective Transcription  Clinically she is alert and oriented times three. No shortness of breath or chest pain. The abdomen is soft and nontender. She has positive dysesthesias in the left C5 and C6 distributions. She has some trace weakness of the deltoid and biceps which may be pain inhibition on the left side, 5/5 on the contralateral side. She has 1+ symmetrical deep tendon reflexes. No clonus. Negative Hoffmann sign. Normal gait pattern. She does have low back pain with palpation and range of motion, but no incontinence of bowel or bladder.    Plans Transcription  At this point in time, we have gone over the MRI and clinical history. The only relief she has had after her fall out of tractor trailer was the injection by Dr. Nelva Bush. At this point, we will plan on doing a 2-level discectomy to help reduce her neck and radicular left arm pain. We have gone over the risks which include infection, bleeding, neve damage, death, stroke, paralysis, failure to heal, need for further surgery, ongoing or worse pain, adjacent segment collapse, throat pain, swallowing difficulties, and hoarseness in the voice. All of her questions are addressed. We will plan on surgery this Friday.

## 2014-06-19 NOTE — Anesthesia Preprocedure Evaluation (Addendum)
Anesthesia Evaluation  Patient identified by MRN, date of birth, ID band Patient awake    Reviewed: Allergy & Precautions, H&P , NPO status , Patient's Chart, lab work & pertinent test results  Airway Mallampati: II      Dental   Pulmonary Current Smoker,  breath sounds clear to auscultation        Cardiovascular Rhythm:Regular Rate:Normal     Neuro/Psych    GI/Hepatic negative GI ROS, Neg liver ROS,   Endo/Other  diabetes, Type 2, Oral Hypoglycemic Agents  Renal/GU negative Renal ROS     Musculoskeletal   Abdominal   Peds  Hematology   Anesthesia Other Findings   Reproductive/Obstetrics                          Anesthesia Physical Anesthesia Plan  ASA: III  Anesthesia Plan: General   Post-op Pain Management:    Induction: Intravenous  Airway Management Planned: Oral ETT  Additional Equipment:   Intra-op Plan:   Post-operative Plan: Possible Post-op intubation/ventilation  Informed Consent: I have reviewed the patients History and Physical, chart, labs and discussed the procedure including the risks, benefits and alternatives for the proposed anesthesia with the patient or authorized representative who has indicated his/her understanding and acceptance.     Plan Discussed with: CRNA and Anesthesiologist  Anesthesia Plan Comments:        Anesthesia Quick Evaluation

## 2014-06-19 NOTE — Anesthesia Postprocedure Evaluation (Signed)
  Anesthesia Post-op Note  Patient: [name]  Procedure(s) Performed: Procedure(s): ACDF/ANTERIOR CERVICAL DISCECTOMY FUSION  C4-C6 (2 LEVELS)  (N/A)  Patient Location: PACU  Anesthesia Type:General  Level of Consciousness: awake  Airway and Oxygen Therapy: Patient Spontanous Breathing  Post-op Pain: mild  Post-op Assessment: Post-op Vital signs reviewed  Post-op Vital Signs: Reviewed  Last Vitals:  Filed Vitals:   06/19/14 1530  BP:   Pulse:   Temp: 36.7 C  Resp:     Complications: No apparent anesthesia complications

## 2014-06-20 LAB — GLUCOSE, CAPILLARY
Glucose-Capillary: 190 mg/dL — ABNORMAL HIGH (ref 70–99)
Glucose-Capillary: 178 mg/dL — ABNORMAL HIGH (ref 70–99)

## 2014-06-20 LAB — HEMOGLOBIN A1C
Hgb A1c MFr Bld: 6.1 % — ABNORMAL HIGH (ref <5.7)
Mean Plasma Glucose: 128 mg/dL — ABNORMAL HIGH (ref <117)

## 2014-06-20 MED ORDER — ACETAMINOPHEN 500 MG PO TABS
1000.0000 mg | ORAL_TABLET | Freq: Once | ORAL | Status: AC
  Administered 2014-06-20: 1000 mg via ORAL
  Filled 2014-06-20: qty 2

## 2014-06-20 NOTE — Op Note (Signed)
[name], [name]                ACCOUNT NO.:  [id number]  MEDICAL RECORD NO.:  [id number]  LOCATION:  [id number]                        FACILITY:  [facility]  PHYSICIAN:  Dahlia Bailiff, MD    DATE OF BIRTH:  05/30/1976  DATE OF PROCEDURE:  06/19/2014 DATE OF DISCHARGE:                              OPERATIVE REPORT   PREOPERATIVE DIAGNOSIS:  Cervical spondylotic radiculopathy.  POSTOPERATIVE DIAGNOSIS:  Cervical spondylotic radiculopathy.  OPERATIVE PROCEDURE:  Anterior cervical diskectomy and fusion C4-C6.  COMPLICATIONS:  None.  CONDITION:  Stable.  HISTORY:  This is a very pleasant 38 year old woman, who has been complaining of severe neck and radicular left arm pain.  Attempts at conservative management failed to alleviate her symptoms, so we elected to proceed with surgery.  All appropriate risks, benefits, and alternatives were discussed with the patient.  Consent was obtained.  OPERATIVE NOTE:  The patient was brought to the operating room and placed supine on the operating table.  After successful induction of general anesthesia and endotracheal intubation, TEDs and SCDs were applied.  Rolled the towels and placed underneath the shoulder blades and the arms were secured at the side.  All bony prominences were well padded.  The anterior cervical spine was prepped and draped in a sterile fashion for an anterior approach.  Time-out was taken confirming the patient, procedure, and all other pertinent important data.  Transverse incision was made and centered over the C5-6 disc space.  Sharp dissection was carried out down to and through the platysma.  I then identified the omohyoid and sacrificed this as well.  I continued my dissection with standard Smith-Robinson approach to the anterior cervical spine.  I identified the carotid sheath, protected it with a finger and swept the esophagus and trachea to the right and took down through the prevertebral fascia.  I placed a needle  into C4-5 disk space, confirmed I was at the appropriate level.  I used bipolar electrocautery to mobilize the longus colli muscles from the midbody of C4 and the midbody of C6.  I placed self-retaining retractors underneath the longus colli muscle.  I deflated the endotracheal cuff and expanded the retractors, and reinflated the cuff. An annulotomy was performed with a #15 blade scalpel and then using a combination of pituitary rongeurs and Kerrison rongeurs.  I removed the bulk of the disk material.  Distraction pins were placed into the body of C4 and C5, and then I distracted the interbody space.  I then removed the remaining portion of the disk and released the posterior and longitudinal ligament.  I used a nerve hook to develop a plane underneath the posterior and longitudinal ligament.  I used a 1 mm Kerrison to resect it.  I did remove the osteophytes from the uncovertebral joint on the left side at C4-5 and a small amount of hard osteophyte disk.  I rasped the endplates, measured and placed a 7-small Titan titanium cage, packed with DBX mix, this got excellent purchase and fixation.  I then repositioned my distraction pins into the body of C6 and C5.  I distracted the intervertebral space using the same technique as used at C4-5, I completed diskectomy.  There was more of the soft disk components in the posterior lateral gutter at the side.  I removed this and again took down the posterior and longitudinal ligament.  Satisfied with the decompression, I placed the same sized interbody spacer.  A diffuse guideline 32-mm anterior and cervical plate was then affixed to the anterior cervical spine with 16 mm screws into the body of C4 and 14 mm screws into the body of C5 and C6.  I irrigated the wound copiously with normal saline and made sure I had hemostasis using bipolar electrocautery.  I then removed all the remaining retractors, returned the trachea and esophageus to midline  and closed the platysma with 2-0 Vicryl sutures and a 3-0 Monocryl for the skin.  Steri-Strips and dry dressing were applied.  The patient was extubated, transferred to PACU without incident.  At the end of the case, all needle and sponge counts were correct.  There is no adverse intraoperative events.     Dahlia Bailiff, MD     DDB/MEDQ  D:  06/19/2014  T:  06/20/2014  Job:  [id number]

## 2014-06-20 NOTE — Progress Notes (Signed)
    Subjective: Procedure(s) (LRB): ACDF/ANTERIOR CERVICAL DISCECTOMY FUSION  C4-C6 (2 LEVELS)  (N/A) 1 Day Post-Op  Patient reports pain as 2 on 0-10 scale.  Reports decreased arm pain reports incisional neck pain   Positive void Negative bowel movement Positive flatus Negative chest pain or shortness of breath  Objective: Vital signs in last 24 hours: Temp:  [98 F (36.7 C)-98.3 F (36.8 C)] 98.2 F (36.8 C) (06/27 0407) Pulse Rate:  [83-121] 98 (06/27 0407) Resp:  [16-22] 18 (06/27 0407) BP: (99-160)/(56-109) 110/56 mmHg (06/27 0407) SpO2:  [91 %-100 %] 96 % (06/27 0407) Weight:  [221 lb (100.245 kg)] 221 lb (100.245 kg) (06/26 1056)  Intake/Output from previous day: 06/26 0701 - 06/27 0700 In: 1985 [P.O.:170; I.V.:1705; IV Piggyback:110] Out: 100 [Blood:100]  Labs: No results found for this basename: WBC, RBC, HCT, PLT,  in the last 72 hours No results found for this basename: NA, K, CL, CO2, BUN, CREATININE, GLUCOSE, CALCIUM,  in the last 72 hours No results found for this basename: LABPT, INR,  in the last 72 hours  Physical Exam: Neurologically intact ABD soft Neurovascular intact Incision: dressing C/D/I Compartment soft  Assessment/Plan: Patient stable  xrays satisfactory Mobilization with physical therapy Encourage incentive spirometry Continue care  Advance diet Up with therapy Doing well - ok for d/c to home  Melina Schools, MD [place] [phone]

## 2014-06-20 NOTE — Progress Notes (Signed)
Occupational Therapy Evaluation Patient Details Name: [name] MRN: [id number] DOB: [birth year] Today's Date: 06/20/2014    History of Present Illness pt presents with C4-6 ACDF.     Clinical Impression   PTA pt lived at home and was independent with ADLs and functional mobility. Pt currently requires Supervision for cervical precautions during ADLs and ambulation. Pt reports she will have 24/7 assistance at home. No further acute OT needs.     Follow Up Recommendations  No OT follow up;Supervision/Assistance - 24 hour    Equipment Recommendations  Tub/shower bench       Precautions / Restrictions Precautions Precautions: Cervical Precaution Comments: Educated pt on incorporating precautions into ADLs.  Required Braces or Orthoses: Cervical Brace Cervical Brace: Hard collar;At all times Restrictions Weight Bearing Restrictions: No      Mobility Bed Mobility Overal bed mobility: Needs Assistance Bed Mobility: Rolling;Sidelying to Sit Rolling: Supervision Sidelying to sit: Min assist       General bed mobility comments: truncal support  Transfers Overall transfer level: Needs assistance Equipment used: None Transfers: Sit to/from Stand Sit to Stand: Supervision         General transfer comment: increased time, however no (A) required    Balance Overall balance assessment: No apparent balance deficits (not formally assessed)                                          ADL Overall ADL's : Needs assistance/impaired Eating/Feeding: Independent;Sitting   Grooming: Supervision/safety;Standing   Upper Body Bathing: Supervision/ safety;Sitting   Lower Body Bathing: Supervison/ safety;Sit to/from stand   Upper Body Dressing : Supervision/safety;Sitting   Lower Body Dressing: Supervision/safety;Sit to/from stand   Toilet Transfer: Supervision/safety   Toileting- Water quality scientist and Hygiene: Supervision/safety   Tub/ Engineer, petroleum: Supervision/safety;Tub transfer;Ambulation;Tub bench   Functional mobility during ADLs: Supervision/safety General ADL Comments: Pt overall supervision for cervical precautions during ADLs. Pt reports that she will have assistance from her husband and from her 39 year old daughter who is on summer vacation. Educated pt on incorporating cervical precautions into ADLs and practiced donning/doffing cervical brace.      Vision  Per pt report, no change from baseline. No apparent visual deficits.                    Perception Perception Perception Tested?: No   Praxis Praxis Praxis tested?: Within functional limits    Pertinent Vitals/Pain NAD     Hand Dominance Right   Extremity/Trunk Assessment Upper Extremity Assessment Upper Extremity Assessment: Overall WFL for tasks assessed;Generalized weakness   Lower Extremity Assessment Lower Extremity Assessment: Defer to PT evaluation   Cervical / Trunk Assessment Cervical / Trunk Assessment: Normal   Communication Communication Communication: No difficulties   Cognition Arousal/Alertness: Awake/alert Behavior During Therapy: WFL for tasks assessed/performed Overall Cognitive Status: Within Functional Limits for tasks assessed                                Home Living Family/patient expects to be discharged to:: Private residence Living Arrangements: Spouse/significant other;Children Available Help at Discharge: Family;Available 24 hours/day Type of Home: House Home Access: Stairs to enter [employer] of Steps: 3 Entrance Stairs-Rails: Left Home Layout: One level     Bathroom Shower/Tub: Tub/shower unit Shower/tub characteristics: Architectural technologist: Standard  Home Equipment: None   Additional Comments: pt reports she has already ordered a tub bench through CM      Prior Functioning/Environment Level of Independence: Independent                                        End of Session Equipment Utilized During Treatment: Cervical collar;Gait belt  Activity Tolerance: Patient tolerated treatment well Patient left: in bed;with call bell/phone within reach;with family/visitor present   Time: 8315-1761 OT Time Calculation (min): 11 min Charges:  OT General Charges $OT Visit: 1 Procedure OT Evaluation $Initial OT Evaluation Tier I: 1 Procedure G-Codes: OT G-codes **NOT FOR INPATIENT CLASS** Functional Assessment Tool Used: clinical judgment Functional Limitation: Self care Self Care Current Status (Y0737): At least 1 percent but less than 20 percent impaired, limited or restricted Self Care Goal Status (T0626): At least 1 percent but less than 20 percent impaired, limited or restricted Self Care Discharge Status [phone]): At least 1 percent but less than 20 percent impaired, limited or restricted   Juluis Rainier [phone] 06/20/2014, 2:05 PM

## 2014-06-20 NOTE — Care Management Note (Signed)
CARE MANAGEMENT NOTE 06/20/2014  Patient:  [name], [name]   Account Number:  [id number]  Date Initiated:  06/20/2014  Documentation initiated by:  Ricki Miller  Subjective/Objective Assessment:   38 yr old female s/p C4-C6 ACDF     Action/Plan:   Therapist requested patient have a tub bench for home. no further home health or DME needs. Case manager will sign off.   Anticipated DC Date:  06/20/2014   Anticipated DC Plan:  [place]  CM consult      PAC Choice  DURABLE MEDICAL EQUIPMENT   Choice offered to / List presented to:     DME arranged  TUB BENCH      DME agency  [facility] arranged  NA      Status of service:  Completed, signed off Medicare Important Message given?   (If response is "NO", the following Medicare IM given date fields will be blank) Date Medicare IM given:   Date Additional Medicare IM given:    Discharge Disposition:  [place]  Per UR Regulation:

## 2014-06-20 NOTE — Evaluation (Signed)
Physical Therapy Evaluation Patient Details Name: [name] MRN: [id number] DOB: [birth year] Today's Date: 06/20/2014   History of Present Illness  pt presents with C4-6 ACDF.    Clinical Impression  Pt moving well and will have great support from family at D/C.  Reviewed Cervical Precaution Paper and safety at home.  Discussed options for shower seat and hand-held shower due to pt's low back problems as well as new ACDF.  No further acute PT needs at this time.  Will sign off.      Follow Up Recommendations No PT follow up;Supervision - Intermittent    Equipment Recommendations   (Shower chair vs bench)    Recommendations for Other Services       Precautions / Restrictions Precautions Precautions: Cervical Required Braces or Orthoses: Cervical Brace Cervical Brace: Hard collar;At all times Restrictions Weight Bearing Restrictions: No      Mobility  Bed Mobility Overal bed mobility: Needs Assistance Bed Mobility: Rolling;Sidelying to Sit Rolling: Supervision Sidelying to sit: Min assist       General bed mobility comments: Husband A with bringing trunk up to sitting.    Transfers Overall transfer level: Needs assistance Equipment used: None Transfers: Sit to/from Stand Sit to Stand: Supervision         General transfer comment: Moves cautiously, but without A.    Ambulation/Gait Ambulation/Gait assistance: Supervision Ambulation Distance (Feet): 160 Feet Assistive device: None Gait Pattern/deviations: Step-through pattern;Decreased stride length     General Gait Details: pt moves slowly and cautiously, but demos good mobility and attention to safety.    Stairs Stairs: Yes Stairs assistance: Supervision Stair Management: One rail Left;Forwards;Step to pattern Number of Stairs: 2 General stair comments: pt demos good attention to safety on stairs.    Wheelchair Mobility    Modified Rankin (Stroke Patients Only)       Balance                                              Pertinent Vitals/Pain 3/10.  Premedicated.      Home Living Family/patient expects to be discharged to:: Private residence Living Arrangements: Spouse/significant other;Children Available Help at Discharge: Family;Available 24 hours/day Type of Home: House Home Access: Stairs to enter Entrance Stairs-Rails: Left Entrance Stairs-Number of Steps: 3 Home Layout: One level Home Equipment: None      Prior Function Level of Independence: Independent               Hand Dominance        Extremity/Trunk Assessment   Upper Extremity Assessment: Defer to OT evaluation           Lower Extremity Assessment: Generalized weakness      Cervical / Trunk Assessment: Normal  Communication   Communication: No difficulties  Cognition Arousal/Alertness: Awake/alert Behavior During Therapy: WFL for tasks assessed/performed Overall Cognitive Status: Within Functional Limits for tasks assessed                      General Comments      Exercises        Assessment/Plan    PT Assessment Patent does not need any further PT services  PT Diagnosis     PT Problem List    PT Treatment Interventions     PT Goals (Current goals can be found in the Care Plan section)  Acute Rehab PT Goals PT Goal Formulation: No goals set, d/c therapy    Frequency     Barriers to discharge        Co-evaluation               End of Session Equipment Utilized During Treatment: Gait belt;Cervical collar Activity Tolerance: Patient tolerated treatment well Patient left: in bed;with call bell/phone within reach;with family/visitor present Nurse Communication: Mobility status    Functional Assessment Tool Used: Clinical Judgement Functional Limitation: Mobility: Walking and moving around Mobility: Walking and Moving Around Current Status (E2683): At least 1 percent but less than 20 percent impaired, limited or  restricted Mobility: Walking and Moving Around Goal Status [phone]): At least 1 percent but less than 20 percent impaired, limited or restricted Mobility: Walking and Moving Around Discharge Status [phone]): At least 1 percent but less than 20 percent impaired, limited or restricted    Time: 0757-0820 PT Time Calculation (min): 23 min   Charges:   PT Evaluation $Initial PT Evaluation Tier I: 1 Procedure PT Treatments $Gait Training: 8-22 mins   PT G Codes:   Functional Assessment Tool Used: Clinical Judgement Functional Limitation: Mobility: Walking and moving around    Catarina Hartshorn, [place] [phone] 06/20/2014, 9:40 AM

## 2014-06-23 ENCOUNTER — Encounter (HOSPITAL_COMMUNITY): Payer: Self-pay | Admitting: Orthopedic Surgery

## 2014-07-13 NOTE — Discharge Summary (Signed)
Patient ID: [name] MRN: [id number] DOB/AGE: 1976-07-22 38 y.o.  Admit date: 06/19/2014 Discharge date: 07/13/2014  Admission Diagnoses:  Active Problems:   Neck pain   Discharge Diagnoses:  Active Problems:   Neck pain  status post Procedure(s): ACDF/ANTERIOR CERVICAL DISCECTOMY FUSION  C4-C6 (2 LEVELS)   Past Medical History  Diagnosis Date  . Fibroids   . Diabetes mellitus without complication     Surgeries: Procedure(s): ACDF/ANTERIOR CERVICAL DISCECTOMY FUSION  C4-C6 (2 LEVELS)  on 06/19/2014   Consultants:    Discharged Condition: Improved  Hospital Course: [name] is an 38 y.o. female who was admitted 06/19/2014 for operative treatment of cervical stenosis. Patient failed conservative treatments (please see the history and physical for the specifics) and had severe unremitting pain that affects sleep, daily activities and work/hobbies. After pre-op clearance, the patient was taken to the operating room on 06/19/2014 and underwent  Procedure(s): ACDF/ANTERIOR CERVICAL DISCECTOMY FUSION  C4-C6 (2 LEVELS) .    Patient was given perioperative antibiotics:  Anti-infectives   Start     Dose/Rate Route Frequency Ordered Stop   06/19/14 1745  ceFAZolin (ANCEF) IVPB 1 g/50 mL premix     1 g 100 mL/hr over 30 Minutes Intravenous Every 8 hours 06/19/14 1730 06/20/14 0242   06/18/14 1443  ceFAZolin (ANCEF) IVPB 2 g/50 mL premix     2 g 100 mL/hr over 30 Minutes Intravenous 30 min pre-op 06/18/14 1443 06/19/14 1230       Patient was given sequential compression devices and early ambulation to prevent DVT.   Patient benefited maximally from hospital stay and there were no complications. At the time of discharge, the patient was urinating/moving their bowels without difficulty, tolerating a regular diet, pain is controlled with oral pain medications and they have been cleared by PT/OT.   Recent vital signs: No data found.    Recent laboratory studies: No  results found for this basename: WBC, HGB, HCT, PLT, NA, K, CL, CO2, BUN, CREATININE, GLUCOSE, PT, INR, CALCIUM, 2,  in the last 72 hours   Discharge Medications:     Medication List    STOP taking these medications       acetaminophen 500 MG tablet  Commonly known as:  TYLENOL     HYDROcodone-acetaminophen 5-325 MG per tablet  Commonly known as:  NORCO/VICODIN  Replaced by:  HYDROcodone-acetaminophen 10-325 MG per tablet      TAKE these medications       diphenhydrAMINE 25 MG tablet  Commonly known as:  BENADRYL  Take 25 mg by mouth 3 (three) times daily. Take 1 tablet with hydrocodone     HYDROcodone-acetaminophen 10-325 MG per tablet  Commonly known as:  NORCO  Take 1 tablet by mouth every 6 (six) hours as needed.     metFORMIN 500 MG tablet  Commonly known as:  GLUCOPHAGE  Take 500 mg by mouth daily with breakfast.     methocarbamol 500 MG tablet  Commonly known as:  ROBAXIN  Take 1 tablet (500 mg total) by mouth 3 (three) times daily as needed for muscle spasms.     ondansetron 4 MG tablet  Commonly known as:  ZOFRAN  Take 1 tablet (4 mg total) by mouth every 8 (eight) hours as needed for nausea or vomiting.        Diagnostic Studies: Dg Cervical Spine 2 Or 3 Views  06/19/2014   CLINICAL DATA:  Post fusion.  EXAM: CERVICAL SPINE - 2-3 VIEW  COMPARISON:  06/19/2014  FINDINGS: Changes of anterior fusion from C4-C6. Normal alignment. No hardware or bony complicating feature. Mild retropharyngeal soft tissue swelling compatible with recent postoperative state.  IMPRESSION: Post fusion changes from X7-D5 without complicating feature.   Electronically Signed   By: Rolm Baptise M.D.   On: 06/19/2014 16:58   Dg Cervical Spine 2-3 Views  06/19/2014   CLINICAL DATA:  periop localization; HNP  EXAM: DG C-ARM 61-120 MIN; CERVICAL SPINE - 2-3 VIEW  : COMPARISON:  Preoperative radiographs of the same day  FINDINGS: Interval instrumented ACDF C4-6, hardware and vertebral bodies  projecting in expected location. Cervicothoracic junction not well seen.  IMPRESSION: 1. ACDF C4-C6.   Electronically Signed   By: Arne Cleveland M.D.   On: 06/19/2014 15:10   Dg Cervical Spine 2 Or 3 Views  06/19/2014   CLINICAL DATA:  pre op fusion  EXAM: CERVICAL SPINE - 2-3 VIEW  COMPARISON:  None.  FINDINGS: No fracture or malalignment. Prevertebral soft tissues are normal. Disc spaces well maintained. Cervicothoracic junction normal.  IMPRESSION: No acute bony abnormality.   Electronically Signed   By: Margaree Mackintosh M.D.   On: 06/19/2014 11:36   Dg C-arm 1-60 Min  06/19/2014   CLINICAL DATA:  periop localization; HNP  EXAM: DG C-ARM 61-120 MIN; CERVICAL SPINE - 2-3 VIEW  : COMPARISON:  Preoperative radiographs of the same day  FINDINGS: Interval instrumented ACDF C4-6, hardware and vertebral bodies projecting in expected location. Cervicothoracic junction not well seen.  IMPRESSION: 1. ACDF C4-C6.   Electronically Signed   By: Arne Cleveland M.D.   On: 06/19/2014 15:10          Follow-up Information   Follow up with Dahlia Bailiff, MD. Schedule an appointment as soon as possible for a visit in 2 weeks. (For wound re-check, As needed)    Specialty:  Orthopedic Surgery   Contact information:   [address] [place] [address] [phone]       Discharge Plan:  discharge to home  Disposition:     Signed: Melina Schools D for Dr. Melina Schools [facility] [phone] 07/13/2014, 4:31 PM

## 2014-10-09 ENCOUNTER — Other Ambulatory Visit: Payer: Self-pay

## 2018-05-21 ENCOUNTER — Encounter (HOSPITAL_BASED_OUTPATIENT_CLINIC_OR_DEPARTMENT_OTHER): Payer: Self-pay | Admitting: Emergency Medicine

## 2018-05-21 ENCOUNTER — Emergency Department (HOSPITAL_BASED_OUTPATIENT_CLINIC_OR_DEPARTMENT_OTHER)
Admission: EM | Admit: 2018-05-21 | Discharge: 2018-05-21 | Disposition: A | Discharge status: Home / Self Care | Payer: BLUE CROSS/BLUE SHIELD | Attending: Emergency Medicine | Admitting: Emergency Medicine

## 2018-05-21 ENCOUNTER — Other Ambulatory Visit: Payer: Self-pay

## 2018-05-21 DIAGNOSIS — E119 Type 2 diabetes mellitus without complications: Secondary | ICD-10-CM | POA: Diagnosis not present

## 2018-05-21 DIAGNOSIS — R222 Localized swelling, mass and lump, trunk: Secondary | ICD-10-CM | POA: Diagnosis present

## 2018-05-21 DIAGNOSIS — L0231 Cutaneous abscess of buttock: Secondary | ICD-10-CM | POA: Insufficient documentation

## 2018-05-21 DIAGNOSIS — F17210 Nicotine dependence, cigarettes, uncomplicated: Secondary | ICD-10-CM | POA: Insufficient documentation

## 2018-05-21 MED ORDER — LIDOCAINE-EPINEPHRINE (PF) 2 %-1:200000 IJ SOLN
INTRAMUSCULAR | Status: AC
  Administered 2018-05-21: 10 mL
  Filled 2018-05-21: qty 10

## 2018-05-21 MED ORDER — LIDOCAINE-EPINEPHRINE 2 %-1:100000 IJ SOLN: 20.0000 mL | Freq: Once | INTRAMUSCULAR | Status: DC

## 2018-05-21 NOTE — ED Provider Notes (Addendum)
[facility] Provider Note: Krista Spurling, MD, FACEP  CSN: [id number] MRN: [id number] ARRIVAL: 05/21/18 at [place]: [place]  Abscess   HISTORY OF PRESENT ILLNESS  05/21/18 1:55 AM [name] is a 42 y.o. female with a 3-day history of a tender, swollen lesion on her left medial buttock.  She is having moderate to severe pain, worse with palpation or movement.  She has not had a fever.  The lesion has not been draining.  She has had axillary abscesses in the past but never one on her buttock.   Past Medical History:  Diagnosis Date  . Diabetes mellitus without complication ([place])   . Fibroids     Past Surgical History:  Procedure Laterality Date  . ABDOMINAL HYSTERECTOMY  06/17/2013  . ABLATION  2008   uterine  . ANTERIOR CERVICAL DECOMP/DISCECTOMY FUSION N/A 06/19/2014   Procedure: ACDF/ANTERIOR CERVICAL DISCECTOMY FUSION  C4-C6 (2 LEVELS) ;  Surgeon: Melina Schools, MD;  Location: [place];  Service: Orthopedics;  Laterality: N/A;  . CHOLECYSTECTOMY    . FOOT SURGERY Right    tumor remove below the 4th toe    No family history on file.  Social History   Tobacco Use  . Smoking status: Current Every Day Smoker    Packs/day: 0.50    Years: 20.00    Pack years: 10.00    Types: Cigarettes  . Smokeless tobacco: Never Used  Substance Use Topics  . Alcohol use: No    Alcohol/week: 0.6 oz    Types: 1 Cans of beer per week  . Drug use: No    Prior to Admission medications   Not on File    Allergies Hydrocodone   REVIEW OF SYSTEMS  Negative except as noted here or in the History of Present Illness.   PHYSICAL EXAMINATION  Initial Vital Signs Blood pressure (!) 141/92, pulse 84, temperature 98.2 F (36.8 C), temperature source Oral, resp. rate 18, height 5\' 1"  (1.549 m), weight 78 kg (172 lb), SpO2 99 %.  Examination General: Well-developed, well-nourished female in no acute distress; appearance consistent with age of  record HENT: normocephalic; atraumatic Eyes: pupils equal, round and reactive to light; extraocular muscles intact Neck: supple Heart: regular rate and rhythm Lungs: clear to auscultation bilaterally Abdomen: soft; nondistended; nontender; bowel sounds present Extremities: No deformity; full range of motion; pulses normal Neurologic: Awake, alert and oriented; motor function intact in all extremities and symmetric; no facial droop Skin: Warm and dry; tender, fluctuant mass left medial buttock consistent with abscess Psychiatric: Normal mood and affect   RESULTS  Summary of this visit's results, reviewed by myself:   EKG Interpretation  Date/Time:    Ventricular Rate:    PR Interval:    QRS Duration:   QT Interval:    QTC Calculation:   R Axis:     Text Interpretation:        Laboratory Studies: No results found for this or any previous visit (from the past 24 hour(s)). Imaging Studies: No results found.  ED COURSE and MDM  Nursing notes and initial vitals signs, including pulse oximetry, reviewed.  Vitals:   05/21/18 0117 05/21/18 0119  BP:  (!) 141/92  Pulse: 84   Resp: 18   Temp: 98.2 F (36.8 C)   TempSrc: Oral   SpO2: 99%   Weight: 78 kg (172 lb)   Height: 5\' 1"  (1.549 m)    Patient advised to remove packing herself  in 2 to 3 days if symptoms are improving or to return if symptoms are worsening.  Consultation with the [facility] state controlled substances database reveals the patient has received no opioid prescriptions in the past 2 years.   PROCEDURES   INCISION AND DRAINAGE Performed by: Shanon Rosser L Consent: Verbal consent obtained. Risks and benefits: risks, benefits and alternatives were discussed Type: abscess  Body area: Left buttock  Anesthesia: local infiltration  Incision was made with a scalpel.  Local anesthetic: lidocaine 2 % with epinephrine  Anesthetic total: 4 ml  Complexity: complex Blunt dissection to break up  loculations  Drainage: purulent  Drainage amount: Copious  Packing material: 1/4 in iodoform gauze  Patient tolerance: Patient tolerated the procedure well with no immediate complications.   ED DIAGNOSES     ICD-10-CM   1. Abscess of left buttock L02.31        Krista Chamblee, MD 05/21/18 0211    Shanon Rosser, MD 05/21/18 [phone]

## 2018-05-21 NOTE — ED Triage Notes (Signed)
Pt c/o abscess to buttock x 2 days.

## 2018-05-21 NOTE — ED Notes (Signed)
Wound cleaned with normal saline and dry 4x4 applied to area and secured with tape.

## 2022-04-02 ENCOUNTER — Emergency Department (HOSPITAL_BASED_OUTPATIENT_CLINIC_OR_DEPARTMENT_OTHER)
Admission: EM | Admit: 2022-04-02 | Discharge: 2022-04-02 | Disposition: A | Discharge status: Home / Self Care | Payer: Self-pay | Attending: Emergency Medicine | Admitting: Emergency Medicine

## 2022-04-02 ENCOUNTER — Other Ambulatory Visit: Payer: Self-pay

## 2022-04-02 ENCOUNTER — Emergency Department (HOSPITAL_BASED_OUTPATIENT_CLINIC_OR_DEPARTMENT_OTHER): Payer: Self-pay

## 2022-04-02 ENCOUNTER — Encounter (HOSPITAL_BASED_OUTPATIENT_CLINIC_OR_DEPARTMENT_OTHER): Payer: Self-pay | Admitting: Emergency Medicine

## 2022-04-02 DIAGNOSIS — R0789 Other chest pain: Secondary | ICD-10-CM | POA: Diagnosis not present

## 2022-04-02 DIAGNOSIS — R079 Chest pain, unspecified: Secondary | ICD-10-CM | POA: Diagnosis present

## 2022-04-02 DIAGNOSIS — R0602 Shortness of breath: Secondary | ICD-10-CM | POA: Diagnosis not present

## 2022-04-02 DIAGNOSIS — M79605 Pain in left leg: Secondary | ICD-10-CM | POA: Diagnosis not present

## 2022-04-02 DIAGNOSIS — F17200 Nicotine dependence, unspecified, uncomplicated: Secondary | ICD-10-CM | POA: Diagnosis not present

## 2022-04-02 LAB — CBC WITH DIFFERENTIAL/PLATELET
Abs Immature Granulocytes: 0.01 10*3/uL (ref 0.00–0.07)
Basophils Absolute: 0 10*3/uL (ref 0.0–0.1)
Basophils Relative: 1 %
Eosinophils Absolute: 0.1 10*3/uL (ref 0.0–0.5)
Eosinophils Relative: 1 %
HCT: 42 % (ref 36.0–46.0)
Hemoglobin: 14.5 g/dL (ref 12.0–15.0)
Immature Granulocytes: 0 %
Lymphocytes Relative: 43 %
Lymphs Abs: 3.4 10*3/uL (ref 0.7–4.0)
MCH: 33.2 pg (ref 26.0–34.0)
MCHC: 34.5 g/dL (ref 30.0–36.0)
MCV: 96.1 fL (ref 80.0–100.0)
Monocytes Absolute: 0.5 10*3/uL (ref 0.1–1.0)
Monocytes Relative: 6 %
Neutro Abs: 3.7 10*3/uL (ref 1.7–7.7)
Neutrophils Relative %: 49 %
Platelets: 383 10*3/uL (ref 150–400)
RBC: 4.37 MIL/uL (ref 3.87–5.11)
RDW: 13.1 % (ref 11.5–15.5)
WBC: 7.7 10*3/uL (ref 4.0–10.5)
nRBC: 0 % (ref 0.0–0.2)

## 2022-04-02 LAB — COMPREHENSIVE METABOLIC PANEL
ALT: 20 U/L (ref 0–44)
AST: 32 U/L (ref 15–41)
Albumin: 4.5 g/dL (ref 3.5–5.0)
Alkaline Phosphatase: 89 U/L (ref 38–126)
Anion gap: 8 (ref 5–15)
BUN: 12 mg/dL (ref 6–20)
CO2: 27 mmol/L (ref 22–32)
Calcium: 9.6 mg/dL (ref 8.9–10.3)
Chloride: 104 mmol/L (ref 98–111)
Creatinine, Ser: 0.74 mg/dL (ref 0.44–1.00)
GFR, Estimated: >60 mL/min (ref >60)
Glucose, Bld: 73 mg/dL (ref 70–99)
Potassium: 4.2 mmol/L (ref 3.5–5.1)
Sodium: 139 mmol/L (ref 135–145)
Total Bilirubin: 0.5 mg/dL (ref 0.3–1.2)
Total Protein: 9.1 g/dL — ABNORMAL HIGH (ref 6.5–8.1)

## 2022-04-02 LAB — TROPONIN I (HIGH SENSITIVITY)
Troponin I (High Sensitivity): 3 ng/L (ref <18)
Troponin I (High Sensitivity): 3 ng/L (ref <18)

## 2022-04-02 LAB — LACTIC ACID, PLASMA: Lactic Acid, Venous: 1.3 mmol/L (ref 0.5–1.9)

## 2022-04-02 LAB — LIPASE, BLOOD: Lipase: 42 U/L (ref 11–51)

## 2022-04-02 MED ORDER — CYCLOBENZAPRINE HCL 10 MG PO TABS: 10.0000 mg | ORAL_TABLET | Freq: Two times a day (BID) | ORAL | 0 refills | Status: AC | PRN

## 2022-04-02 MED ORDER — IOHEXOL 350 MG/ML SOLN
100.0000 mL | Freq: Once | INTRAVENOUS | Status: AC | PRN
  Administered 2022-04-02: 75 mL via INTRAVENOUS

## 2022-04-02 MED ORDER — LIDOCAINE 5 % EX PTCH: 1.0000 | MEDICATED_PATCH | CUTANEOUS | 0 refills | Status: AC

## 2022-04-02 NOTE — ED Triage Notes (Signed)
Pt c/o pain under RT breast and epigastric area; pain started Monday, persistent since yesterday; referred here from [facility] ?

## 2022-04-02 NOTE — ED Provider Notes (Signed)
?[facility] EMERGENCY DEPARTMENT ?Provider Note ? ? ?CSN: [id number] ?Arrival date & time: 04/02/22  1538 ? ?  ? ?History ? ?Chief Complaint  ?Patient presents with  ? Chest Pain  ? ? ?[name] is a 46 y.o. female. ? ?The history is provided by the patient and medical records. No language interpreter was used.  ?Chest Pain ?Pain location:  R chest and R lateral chest ?Pain quality: aching and sharp   ?Pain radiates to:  Does not radiate ?Pain severity:  Moderate ?Onset quality:  Gradual ?Duration:  1 week ?Timing:  Sporadic ?Progression:  Waxing and waning ?Chronicity:  New ?Context: at rest   ?Relieved by:  Certain positions ?Worsened by:  Certain positions and deep breathing ?Ineffective treatments:  None tried ?Associated symptoms: shortness of breath (with deep breathing)   ?Associated symptoms: no abdominal pain, no altered mental status, no anorexia, no anxiety, no back pain, no cough, no diaphoresis, no dizziness, no fatigue, no fever, no headache, no heartburn, no lower extremity edema, no nausea, no near-syncope, no numbness, no palpitations, no vomiting and no weakness   ?Risk factors: smoking   ?Risk factors: no coronary artery disease, no diabetes mellitus, no high cholesterol, no hypertension, not female and no prior DVT/PE   ? ?  ? ?Home Medications ?Prior to Admission medications   ?Not on File  ?   ? ?Allergies    ?Hydrocodone   ? ?Review of Systems   ?Review of Systems  ?Constitutional:  Negative for chills, diaphoresis, fatigue and fever.  ?HENT:  Negative for congestion.   ?Respiratory:  Positive for shortness of breath (with deep breathing). Negative for cough, chest tightness and wheezing.   ?Cardiovascular:  Positive for chest pain. Negative for palpitations, leg swelling and near-syncope.  ?Gastrointestinal:  Negative for abdominal pain, anorexia, constipation, diarrhea, heartburn, nausea and vomiting.  ?Genitourinary:  Negative for dysuria, flank pain and frequency.   ?Musculoskeletal:  Negative for back pain, neck pain and neck stiffness.  ?Skin:  Negative for color change, rash and wound.  ?Neurological:  Negative for dizziness, weakness, numbness and headaches.  ?Psychiatric/Behavioral:  Negative for agitation and confusion.   ?All other systems reviewed and are negative. ? ?Physical Exam ?Updated Vital Signs ?BP 108/75   Pulse 76   Temp 98.1 ?F (36.7 ?C) (Oral)   Resp 20   Ht '5\' 2"'$  (1.575 m)   Wt 73.5 kg   SpO2 100%   BMI 29.63 kg/m?  ?Physical Exam ?Vitals and nursing note reviewed.  ?Constitutional:   ?   General: She is not in acute distress. ?   Appearance: She is well-developed. She is not ill-appearing, toxic-appearing or diaphoretic.  ?HENT:  ?   Head: Normocephalic and atraumatic.  ?Eyes:  ?   Conjunctiva/sclera: Conjunctivae normal.  ?   Pupils: Pupils are equal, round, and reactive to light.  ?Cardiovascular:  ?   Rate and Rhythm: Normal rate and regular rhythm.  ?   Heart sounds: No murmur heard. ?Pulmonary:  ?   Effort: Pulmonary effort is normal. No respiratory distress.  ?   Breath sounds: Normal breath sounds. No decreased breath sounds, wheezing, rhonchi or rales.  ?Chest:  ?   Chest wall: Tenderness present. No lacerations.  ? ? ?Abdominal:  ?   General: Abdomen is flat. A surgical scar is present. Bowel sounds are normal.  ?   Palpations: Abdomen is soft.  ?   Tenderness: There is no abdominal tenderness. There is no guarding or  rebound.  ?   Comments: Abdomen nontender on my initial exam.  ?Musculoskeletal:     ?   General: No swelling.  ?   Cervical back: Neck supple.  ?   Right lower leg: No tenderness. No edema.  ?   Left lower leg: No tenderness. No edema.  ?Skin: ?   General: Skin is warm and dry.  ?   Capillary Refill: Capillary refill takes less than 2 seconds.  ?Neurological:  ?   General: No focal deficit present.  ?   Mental Status: She is alert.  ?Psychiatric:     ?   Mood and Affect: Mood normal. Mood is not anxious.  ? ? ?ED Results /  Procedures / Treatments   ?Labs ?(all labs ordered are listed, but only abnormal results are displayed) ?Labs Reviewed  ?COMPREHENSIVE METABOLIC PANEL - Abnormal; Notable for the following components:  ?    Result Value  ? Total Protein 9.1 (*)   ? All other components within normal limits  ?CBC WITH DIFFERENTIAL/PLATELET  ?LIPASE, BLOOD  ?LACTIC ACID, PLASMA  ?TROPONIN I (HIGH SENSITIVITY)  ?TROPONIN I (HIGH SENSITIVITY)  ? ? ?EKG ?EKG Interpretation ? ?Date/Time:  Sunday April 02 2022 15:56:32 EDT ?Ventricular Rate:  65 ?PR Interval:  126 ?QRS Duration: 74 ?QT Interval:  376 ?QTC Calculation: 391 ?R Axis:   46 ?Text Interpretation: Normal sinus rhythm Normal ECG When compared with ECG of 11-Jun-2014 12:34, PREVIOUS ECG IS PRESENT when compared to prior, similar appearance with more? wandering baseline. No STEMI Confirmed by Antony Blackbird [phone]) on 04/02/2022 4:02:55 PM ? ?Radiology ?CT Angio Chest PE W and/or Wo Contrast ? 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 EXAM: CT ANGIOGRAPHY CHEST WITH CONTRAST TECHNIQUE: Multidetector CT imaging of the chest was performed using the standard protocol during bolus administration of intravenous contrast. Multiplanar CT image reconstructions and MIPs were obtained to evaluate the vascular anatomy. RADIATION DOSE REDUCTION: This exam was performed according to the departmental dose-optimization program which includes automated exposure control, adjustment of the mA and/or kV according to patient size and/or use of iterative reconstruction technique. CONTRAST:  71m OMNIPAQUE IOHEXOL 350 MG/ML SOLN COMPARISON:  CT angiography chest 11/03/2007 FINDINGS: Cardiovascular: Satisfactory opacification of the pulmonary arteries to the segmental level. No evidence of pulmonary embolism. Normal heart size.  No significant pericardial effusion. The thoracic aorta is normal in caliber. No atherosclerotic plaque of the thoracic aorta. No coronary artery calcifications. Mediastinum/Nodes: No enlarged mediastinal, hilar, or axillary lymph nodes. Thyroid gland, trachea, and esophagus demonstrate no significant findings. Lungs/Pleura: No focal consolidation. No pulmonary nodule. No pulmonary mass. No pleural effusion. No pneumothorax. Upper Abdomen: Gastric sleeve formation.  No acute abnormality. Musculoskeletal: No chest wall abnormality. No suspicious lytic or blastic osseous lesions. No acute displaced fracture. Review of the MIP images confirms the above findings. IMPRESSION: 1. No pulmonary embolus. 2. No acute intrathoracic abnormality Electronically Signed   By: MIven FinnM.D.   On: 04/02/2022 17:29  ? ?UKoreaVenous Img Lower Unilateral Left ? ?Result Date: 04/02/2022 ?CLINICAL DATA:  Leg pain left lower extremity.  Hit side of table. EXAM: Left LOWER EXTREMITY VENOUS DOPPLER ULTRASOUND TECHNIQUE: Gray-scale sonography with graded compression, as well as color Doppler and duplex ultrasound were performed to evaluate the lower extremity deep venous systems from the level of the common femoral  vein and including the common femoral, femoral, profunda femoral, popliteal and calf veins including the posterior tibial, peroneal and gastrocnemius veins when visible. The superficial great saphenous vein was also interrogated. Spectral Doppler was utilized to evaluate flow at rest and with distal augmentation maneuvers in the common femoral, femoral and popliteal veins. COMPARISON:  None. FINDINGS: Contralateral Common Femoral Vein: Respiratory phasicity is normal and symmetric with the symptomatic side. No evidence of thrombus. Normal compressibility. Common Femoral Vein: No evidence of thrombus. Normal compressibility, respiratory phasicity. Saphenofemoral Junction: No evidence of thrombus. Normal compressibility and flow on  color Doppler imaging. Profunda Femoral Vein: No evidence of thrombus. Normal compressibility and flow on color Doppler imaging. Femoral Vein: No evidence of thrombus. Normal compressibility, respiratory phasicity. [place]

## 2022-04-02 NOTE — Discharge Instructions (Signed)
Your history, exam, work-up today are consistent with muscle skeletal pain in your right and anterior chest wall.  The CT scan did not show any blood clot, pneumonia, collapsed lung, or other acute abnormality today.  Your labs were also reassuring as we discussed including both cardiac enzymes.  Given the reassuring work-up I suspect this is your chest wall hurting and we agreed to try the muscle medicines to help.  Please use the muscle relaxant and Lidoderm patches to help with the discomfort and please rest and stay hydrated.  Please do not drive taking the muscle relaxants as it can make you drowsy.  Please follow-up with your primary doctor and if any symptoms change or worsen acutely, please return to the nearest emergency department.  ?

## 2022-11-15 IMAGING — US US EXTREM LOW VENOUS*L*
1 series · 13 of 24 positions shown · non-contrast
Comparison: None.

CLINICAL DATA: Leg pain left lower extremity.  Hit side of table.



[Series 1: us extrem low venous*left* · 13 of 42 slices shown]
[im 1/42]
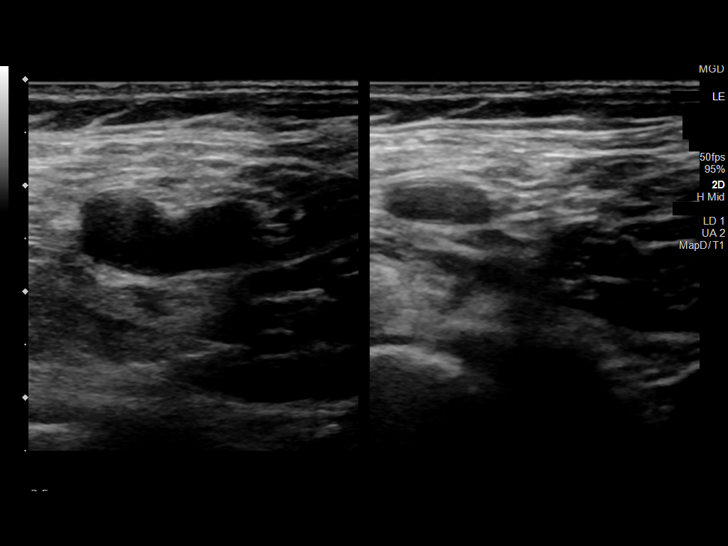
[im 4/42]
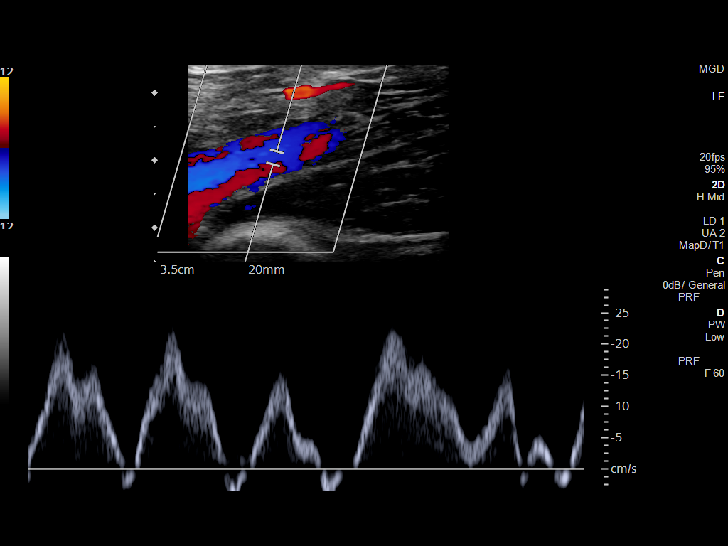
[im 8/42]
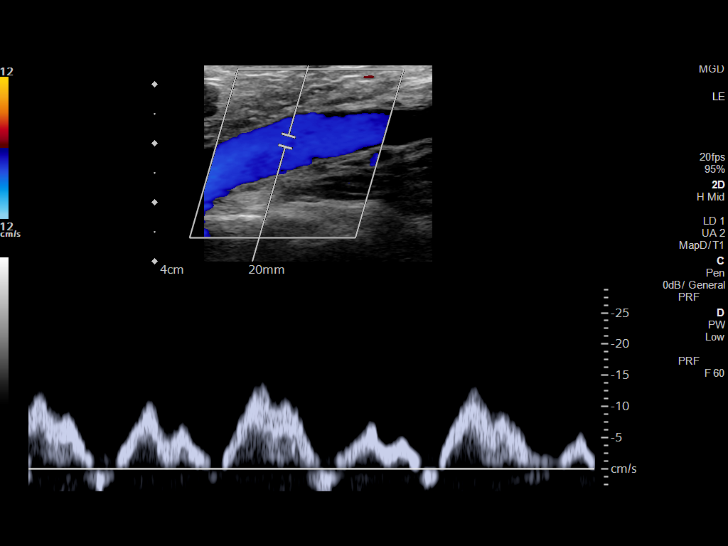
[im 11/42]
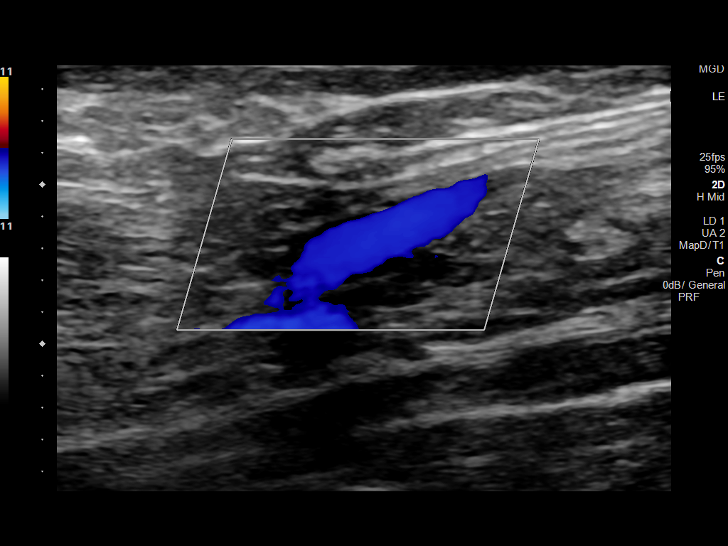
[im 15/42]
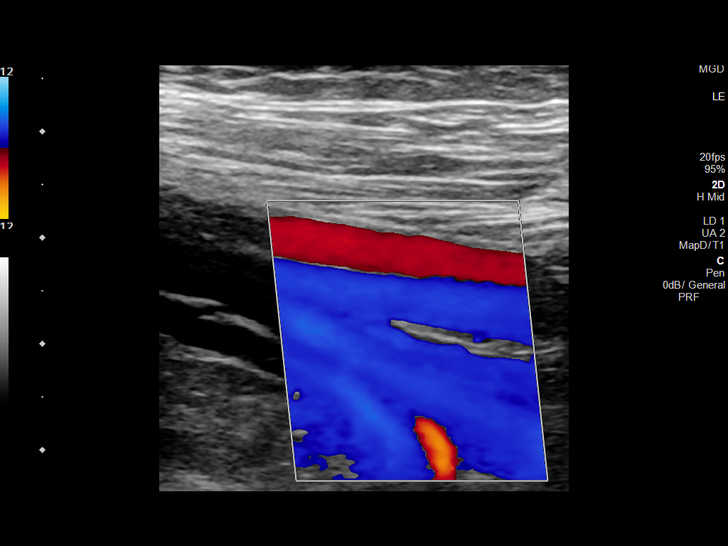
[im 18/42]
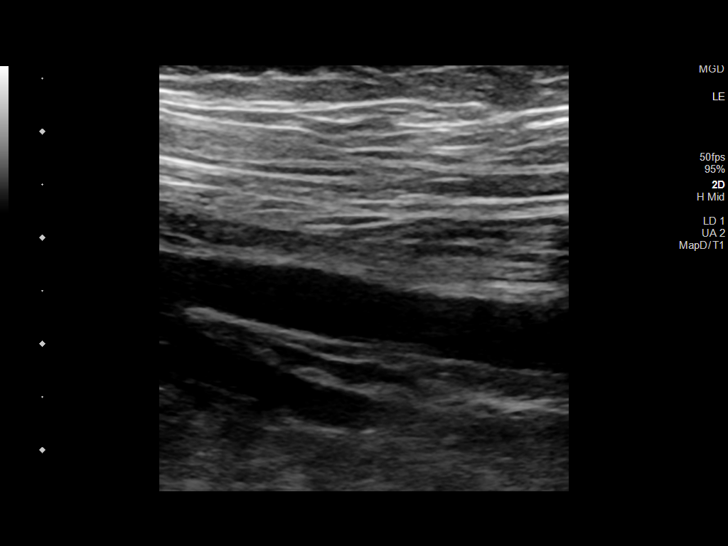
[im 22/42]
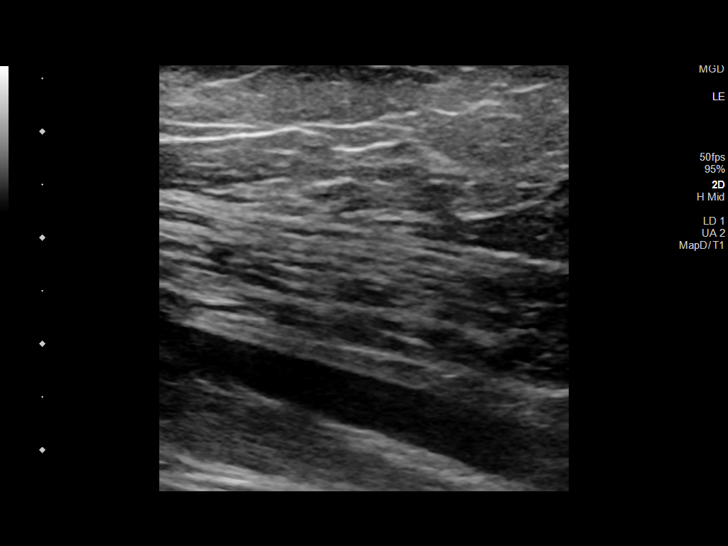
[im 24/42]
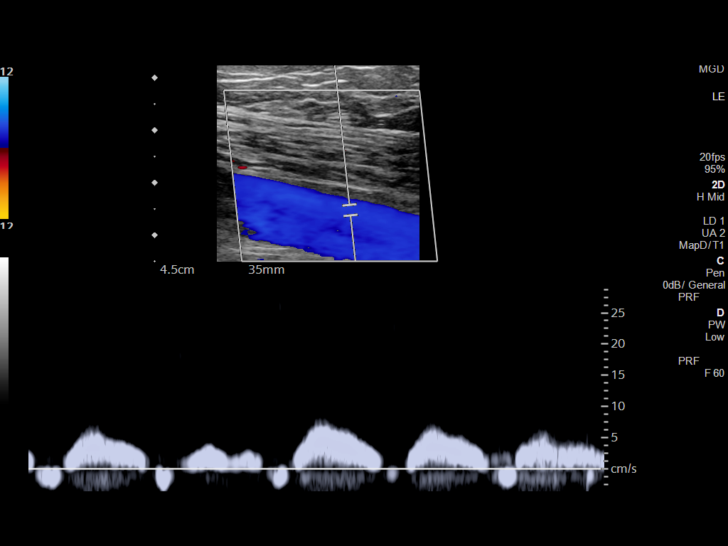
[im 27/42]
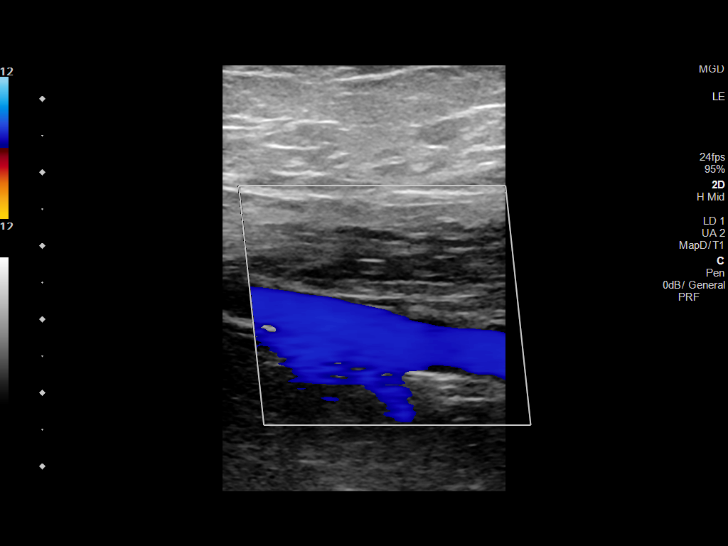
[im 31/42]
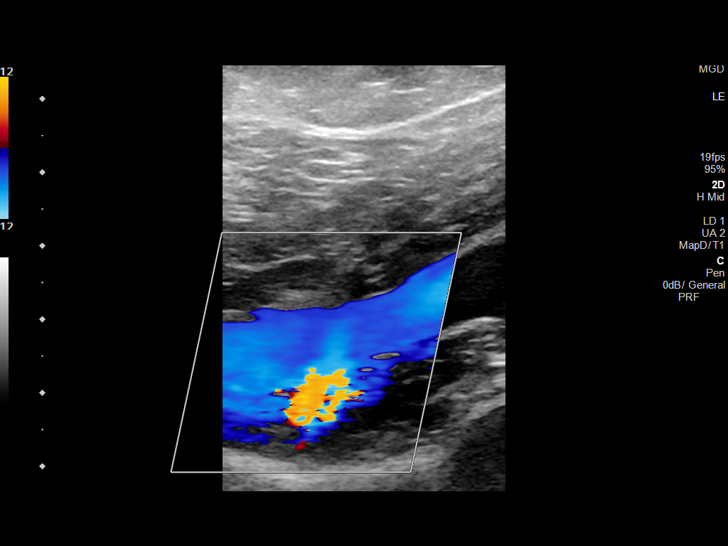
[im 34/42]
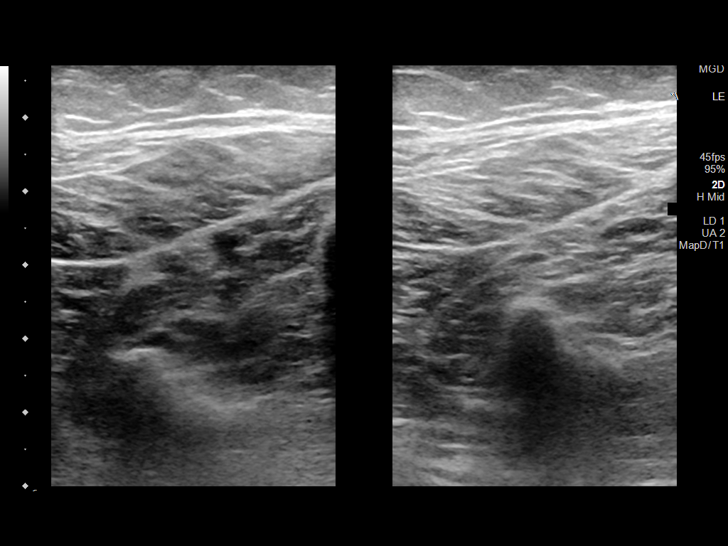
[im 38/42]
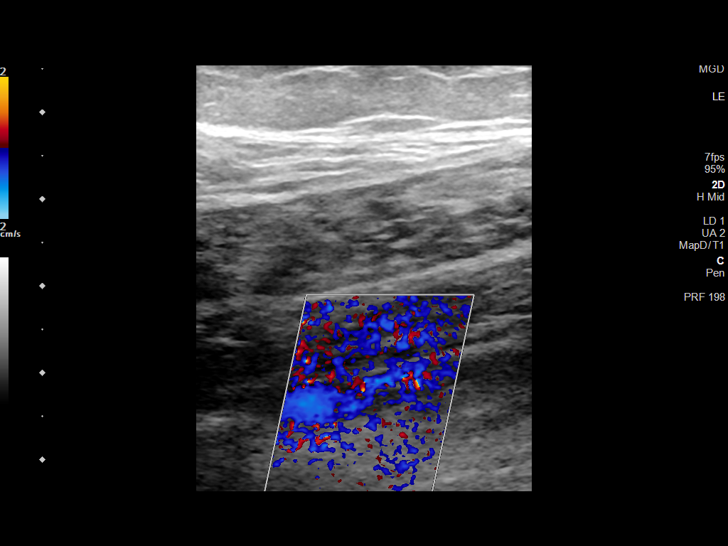
[im 42/42]
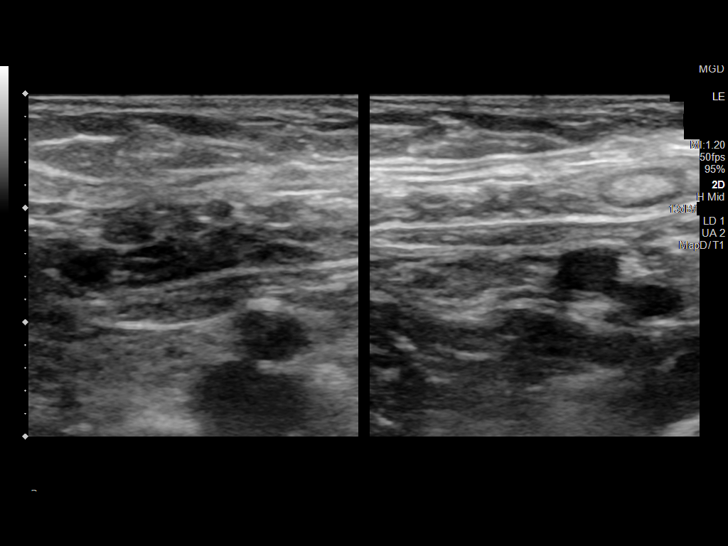

[13 of 24 positions shown; findings below may reference images not displayed]

FINDINGS: Contralateral Common Femoral Vein: Respiratory phasicity is normal
and symmetric with the symptomatic side. No evidence of thrombus.
Normal compressibility.

Common Femoral Vein: No evidence of thrombus. Normal
compressibility, respiratory phasicity.

Saphenofemoral Junction: No evidence of thrombus. Normal
compressibility and flow on color Doppler imaging.

Profunda Femoral Vein: No evidence of thrombus. Normal
compressibility and flow on color Doppler imaging.

Femoral Vein: No evidence of thrombus. Normal compressibility,
respiratory phasicity.

Popliteal Vein: No evidence of thrombus. Normal compressibility,
respiratory phasicity.

Calf Veins: No evidence of thrombus. Normal compressibility and flow
on color Doppler imaging.

Superficial Great Saphenous Vein: No evidence of thrombus. Normal
compressibility.

Venous Reflux:  None.

Other Findings:  None.
IMPRESSION: No evidence of deep venous thrombosis of the left lower extremity.

## 2022-11-15 IMAGING — CT CT ANGIO CHEST
2 of 8 series · 18 of 36 positions shown · IV contrast (Omnipaque)
Comparison: CT angiography chest 11/03/2007

CLINICAL DATA: Pulmonary embolism (PE) suspected, high prob
Long-distance driver, recent left leg pain, new right-sided chest
pain is very pleuritic with some shortness of breath with deep
breathing.

EXAM:
CT ANGIOGRAPHY CHEST WITH CONTRAST
TECHNIQUE: Multidetector CT imaging of the chest was performed using the
standard protocol during bolus administration of intravenous
contrast. Multiplanar CT image reconstructions and MIPs were
obtained to evaluate the vascular anatomy.

[Series 5: pe thins · axial · 0.60mm/px · z∈[-226,-26]mm · 17 of 224 slices shown]
[im 12/224  lung]
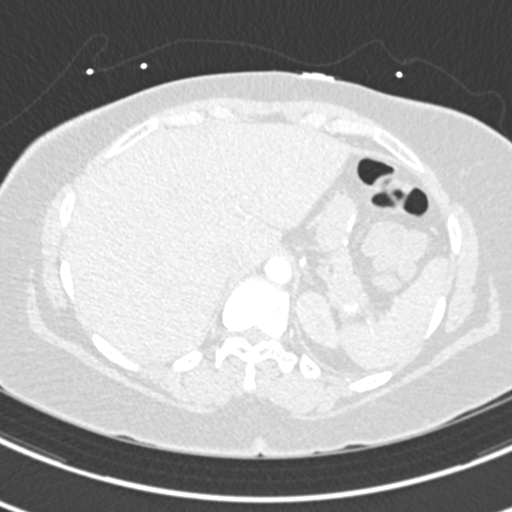
[im 24/224  mediastinal]
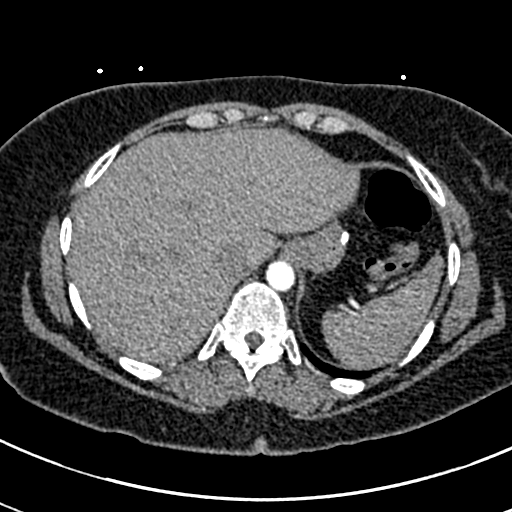
[im 36/224  lung]
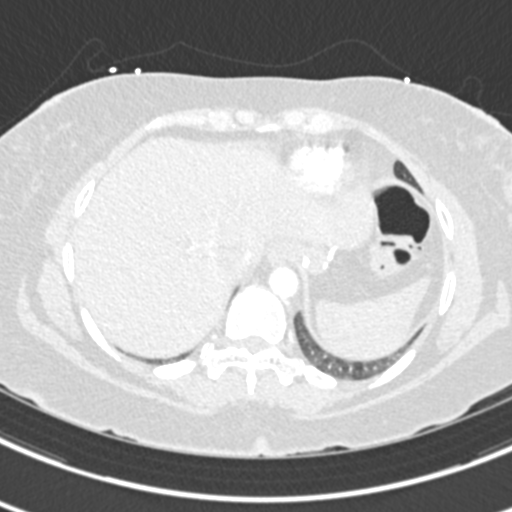
[im 47/224  mediastinal]
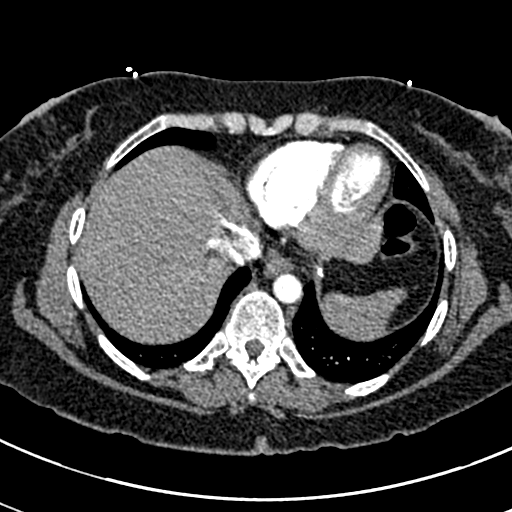
[im 59/224  lung]
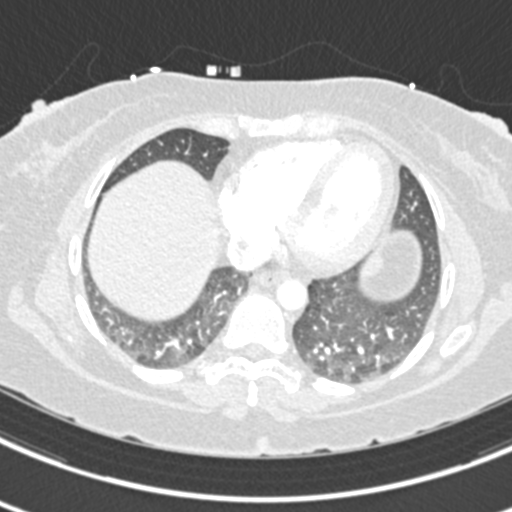
[im 71/224  mediastinal]
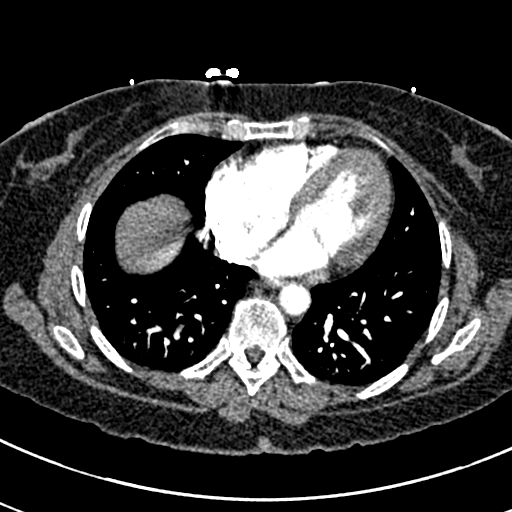
[im 83/224  lung]
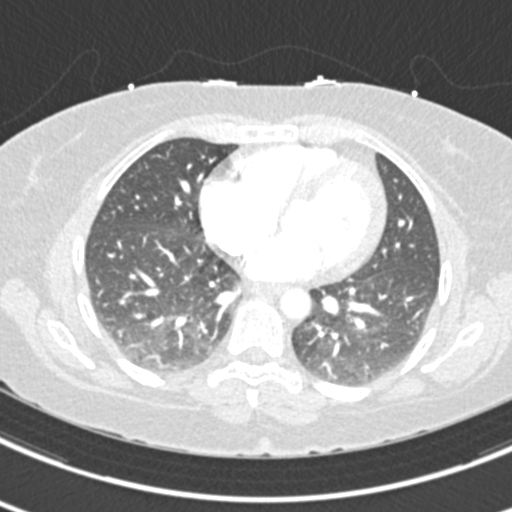
[im 94/224  mediastinal]
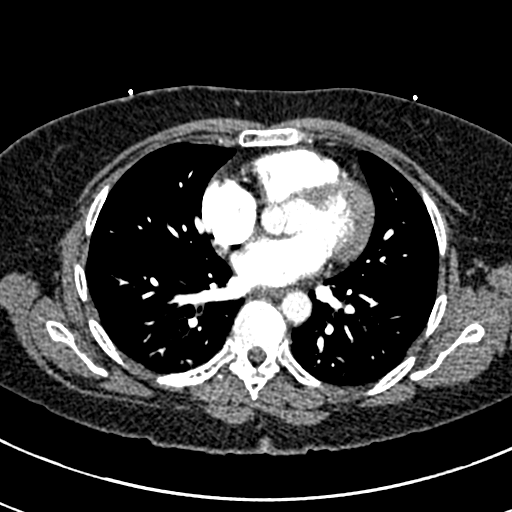
[im 118/224  lung]
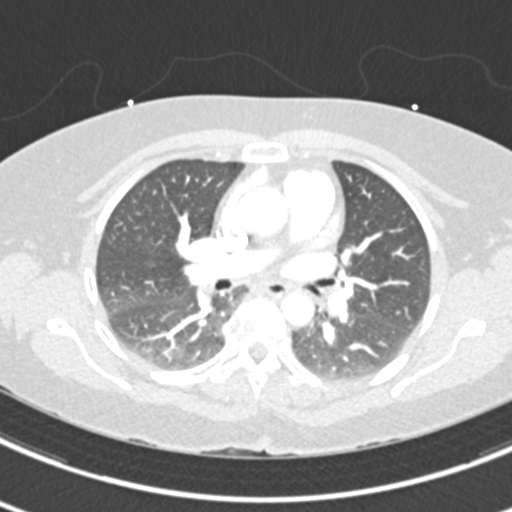
[im 130/224  mediastinal]
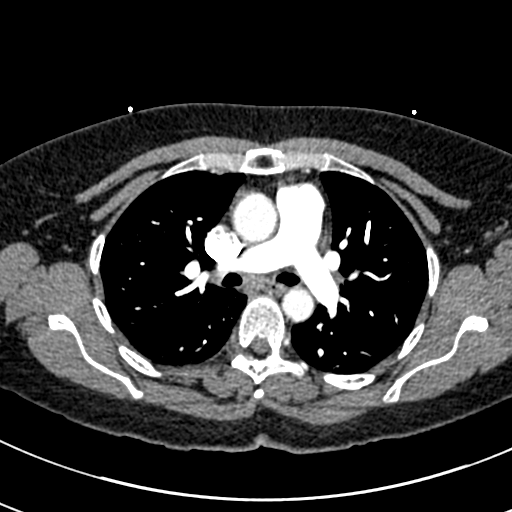
[im 141/224  lung]
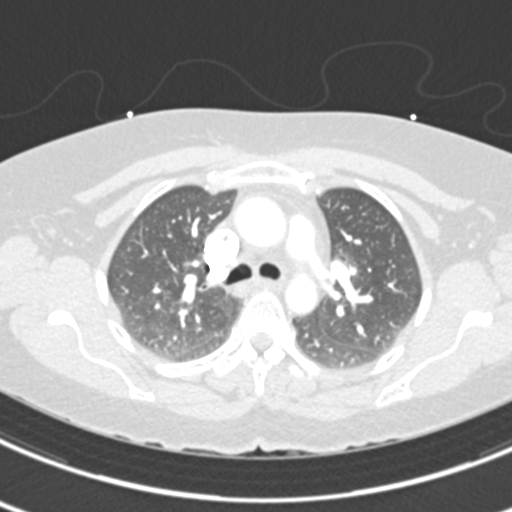
[im 153/224  mediastinal]
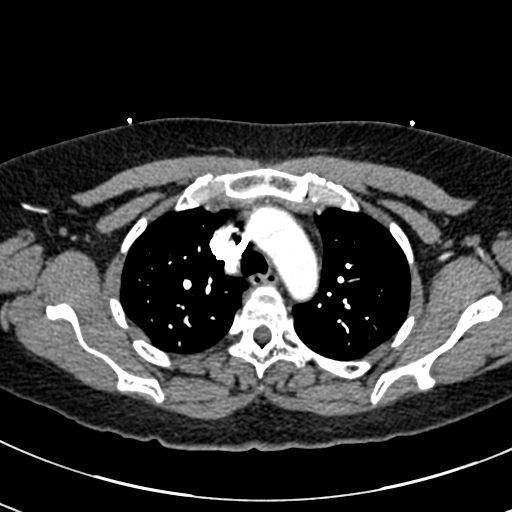
[im 165/224  lung]
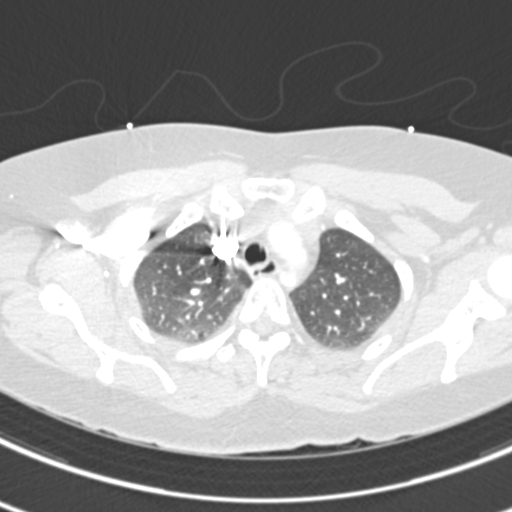
[im 177/224  mediastinal]
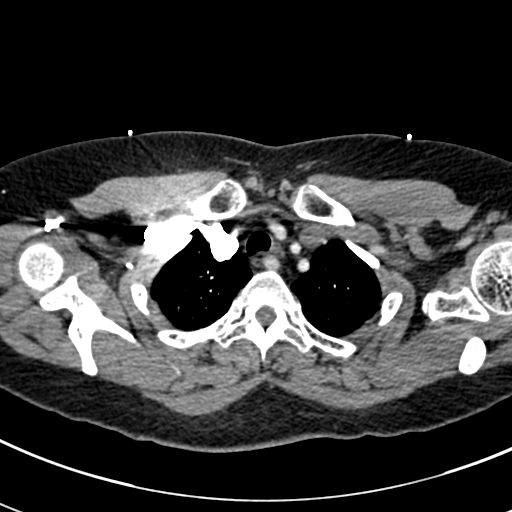
[im 188/224  lung]
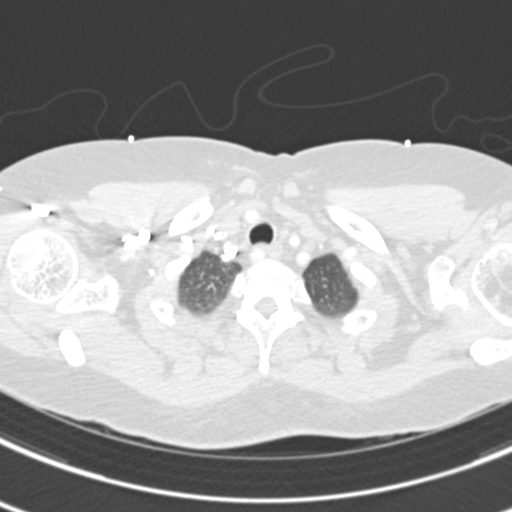
[im 200/224  mediastinal]
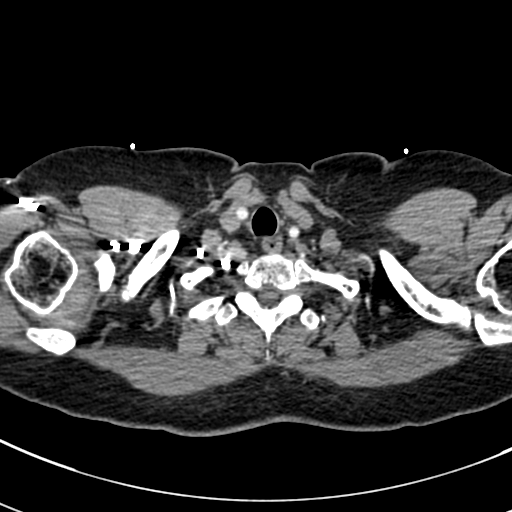
[im 212/224  lung]
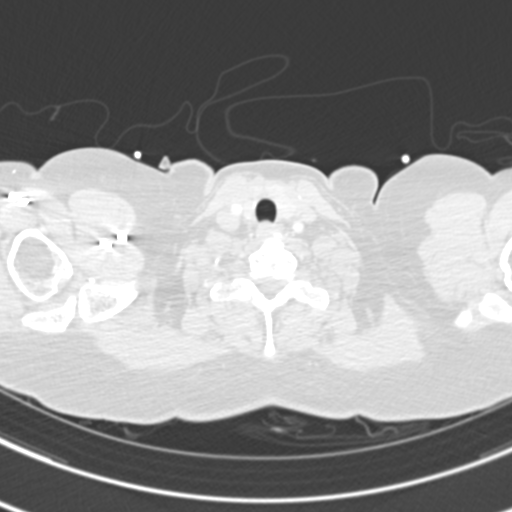

[Series 7: pe coronal mpr · coronal · 0.46mm/px · 1 of 130 slices shown]
[im 65/130  mediastinal]
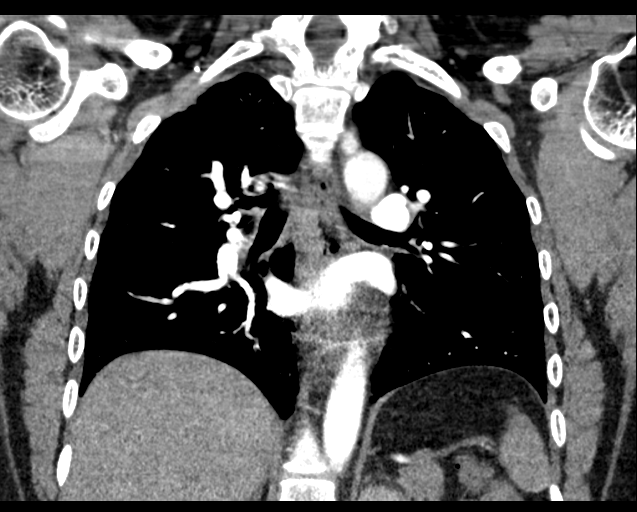

[18 of 36 positions shown; findings below may reference images not displayed]

RADIATION DOSE REDUCTION: This exam was performed according to the
departmental dose-optimization program which includes automated
exposure control, adjustment of the mA and/or kV according to
patient size and/or use of iterative reconstruction technique.

CONTRAST:  75mL OMNIPAQUE IOHEXOL 350 MG/ML SOLN
FINDINGS: Cardiovascular: Satisfactory opacification of the pulmonary arteries
to the segmental level. No evidence of pulmonary embolism. Normal
heart size. No significant pericardial effusion. The thoracic aorta
is normal in caliber. No atherosclerotic plaque of the thoracic
aorta. No coronary artery calcifications.

Mediastinum/Nodes: No enlarged mediastinal, hilar, or axillary lymph
nodes. Thyroid gland, trachea, and esophagus demonstrate no
significant findings.

Lungs/Pleura: No focal consolidation. No pulmonary nodule. No
pulmonary mass. No pleural effusion. No pneumothorax.

Upper Abdomen: Gastric sleeve formation.  No acute abnormality.

Musculoskeletal:

No chest wall abnormality.

No suspicious lytic or blastic osseous lesions. No acute displaced
fracture.

Review of the MIP images confirms the above findings.
IMPRESSION: 1. No pulmonary embolus.
2. No acute intrathoracic abnormality
